# Patient Record
Sex: Female | Born: 1980 | Hispanic: Yes | Marital: Single | Smoking: Never smoker
Health system: Southern US, Community
[De-identification: ages and names within clinical notes are randomized; demographics above are authoritative.]

## PROBLEM LIST (undated history)

## (undated) ENCOUNTER — Inpatient Hospital Stay (HOSPITAL_COMMUNITY): Payer: Self-pay

## (undated) DIAGNOSIS — K8020 Calculus of gallbladder without cholecystitis without obstruction: Secondary | ICD-10-CM

## (undated) DIAGNOSIS — R7611 Nonspecific reaction to tuberculin skin test without active tuberculosis: Secondary | ICD-10-CM

## (undated) DIAGNOSIS — D649 Anemia, unspecified: Secondary | ICD-10-CM

## (undated) DIAGNOSIS — K429 Umbilical hernia without obstruction or gangrene: Secondary | ICD-10-CM

## (undated) DIAGNOSIS — Z789 Other specified health status: Secondary | ICD-10-CM

## (undated) HISTORY — DX: Umbilical hernia without obstruction or gangrene: K42.9

## (undated) HISTORY — DX: Anemia, unspecified: D64.9

## (undated) HISTORY — DX: Nonspecific reaction to tuberculin skin test without active tuberculosis: R76.11

## (undated) HISTORY — DX: Calculus of gallbladder without cholecystitis without obstruction: K80.20

## (undated) HISTORY — PX: NO PAST SURGERIES: SHX2092

---

## 2012-03-04 ENCOUNTER — Other Ambulatory Visit (HOSPITAL_COMMUNITY): Payer: Self-pay | Admitting: Physician Assistant

## 2012-03-04 DIAGNOSIS — Z3689 Encounter for other specified antenatal screening: Secondary | ICD-10-CM

## 2012-03-04 LAB — OB RESULTS CONSOLE GC/CHLAMYDIA
Chlamydia: NEGATIVE
Gonorrhea: NEGATIVE

## 2012-03-04 LAB — OB RESULTS CONSOLE HEPATITIS B SURFACE ANTIGEN: Hepatitis B Surface Ag: NEGATIVE

## 2012-03-04 LAB — OB RESULTS CONSOLE ABO/RH: RH Type: POSITIVE

## 2012-03-04 LAB — OB RESULTS CONSOLE RUBELLA ANTIBODY, IGM: Rubella: IMMUNE

## 2012-03-04 LAB — OB RESULTS CONSOLE HIV ANTIBODY (ROUTINE TESTING): HIV: NONREACTIVE

## 2012-03-04 LAB — OB RESULTS CONSOLE ANTIBODY SCREEN: Antibody Screen: NEGATIVE

## 2012-03-04 LAB — OB RESULTS CONSOLE RPR: RPR: NONREACTIVE

## 2012-03-09 ENCOUNTER — Ambulatory Visit (HOSPITAL_COMMUNITY)
Admission: RE | Admit: 2012-03-09 | Discharge: 2012-03-09 | Disposition: A | Discharge status: Home / Self Care | Payer: Medicaid Other | Source: Ambulatory Visit | Attending: Physician Assistant | Admitting: Physician Assistant

## 2012-03-09 DIAGNOSIS — Z3689 Encounter for other specified antenatal screening: Secondary | ICD-10-CM

## 2012-03-09 DIAGNOSIS — O358XX0 Maternal care for other (suspected) fetal abnormality and damage, not applicable or unspecified: Secondary | ICD-10-CM | POA: Insufficient documentation

## 2012-03-09 DIAGNOSIS — Z363 Encounter for antenatal screening for malformations: Secondary | ICD-10-CM | POA: Insufficient documentation

## 2012-03-09 DIAGNOSIS — Z1389 Encounter for screening for other disorder: Secondary | ICD-10-CM | POA: Insufficient documentation

## 2012-03-16 ENCOUNTER — Inpatient Hospital Stay (HOSPITAL_COMMUNITY)
Admission: AD | Admit: 2012-03-16 | Discharge: 2012-03-16 | Disposition: A | Discharge status: Home / Self Care | Payer: Medicaid Other | Source: Ambulatory Visit | Attending: Obstetrics & Gynecology | Admitting: Obstetrics & Gynecology

## 2012-03-16 ENCOUNTER — Encounter (HOSPITAL_COMMUNITY): Payer: Self-pay

## 2012-03-16 DIAGNOSIS — N949 Unspecified condition associated with female genital organs and menstrual cycle: Secondary | ICD-10-CM

## 2012-03-16 DIAGNOSIS — O99891 Other specified diseases and conditions complicating pregnancy: Secondary | ICD-10-CM | POA: Insufficient documentation

## 2012-03-16 DIAGNOSIS — R109 Unspecified abdominal pain: Secondary | ICD-10-CM | POA: Insufficient documentation

## 2012-03-16 HISTORY — DX: Other specified health status: Z78.9

## 2012-03-16 LAB — URINALYSIS, ROUTINE W REFLEX MICROSCOPIC
Bilirubin Urine: NEGATIVE
Glucose, UA: NEGATIVE mg/dL
Hgb urine dipstick: NEGATIVE
Ketones, ur: NEGATIVE mg/dL
Leukocytes, UA: NEGATIVE
Nitrite: NEGATIVE
Protein, ur: NEGATIVE mg/dL
Specific Gravity, Urine: 1.02 (ref 1.005–1.030)
Urobilinogen, UA: 0.2 mg/dL (ref 0.0–1.0)
pH: 7 (ref 5.0–8.0)

## 2012-03-16 NOTE — MAU Provider Note (Signed)
  History     CSN: [id number]  Arrival date and time: 03/16/12 1118   First Provider Initiated Contact with Patient 03/16/12 1352      Chief Complaint  Patient presents with  . Abdominal Pain   HPI Brenda Richards is 31 y.o. G2P1001 [redacted]w[redacted]d weeks presenting with left lower abdominal pain the entire pregnancy.  [facility]  Had ultrasound here on last week.  Denies vaginal bleeding or discharge.  Pain is intermittent daily. Occurs with standing or moving quickly. It is a pulling type of pain, not sharp per her report.  Hasn't used tylenol for discomfort.  Patient is concerned because she understood the ultrasound report to be abnormal.     Past Medical History  Diagnosis Date  . No pertinent past medical history     Past Surgical History  Procedure Date  . No past surgeries     Family History  Problem Relation Age of Onset  . Anesthesia problems Neg Hx     History  Substance Use Topics  . Smoking status: Never Smoker   . Smokeless tobacco: Not on file  . Alcohol Use: No    Allergies: No Known Allergies  Prescriptions prior to admission  Medication Sig Dispense Refill  . Prenatal Vit-Fe Fumarate-FA (PRENATAL MULTIVITAMIN) TABS Take 1 tablet by mouth every morning.        Review of Systems  Gastrointestinal: Positive for abdominal pain. Vomiting: lower left sided pulling pain.  Genitourinary:       Negative for vaginal bleeding or discharge   Physical Exam   Blood pressure 123/58, pulse 87, temperature 98.1 F (36.7 C), temperature source Oral, resp. rate 16, height 5\' 3"  (1.6 m), weight 73.936 kg (163 lb), SpO2 100.00%.  Physical Exam  Constitutional: She is oriented to person, place, and time. She appears well-developed and well-nourished. No distress.  HENT:  Head: Normocephalic.  Neck: Normal range of motion.  Cardiovascular: Normal rate.   Respiratory: Effort normal.  GI: Soft.  Musculoskeletal: Tenderness:  points to left lateral wall where  discomfort is when it occurs.  Neurological: She is alert and oriented to person, place, and time.  Skin: Skin is warm and dry.  Psychiatric: She has a normal mood and affect. Her behavior is normal.    MAU Course  Procedures  MDM  I reviewed the4/22/13  ultrasound report with the patient.     Assessment and Plan  A:  Round ligament pain at [redacted]w[redacted]d gestation  P:  Instructed to move less quickly and to take tylenol as needed for discomfort.     Keep scheduled appointment made for prenatal care.   Brenda Richards,EVE M 03/16/2012, 2:00 PM

## 2012-03-16 NOTE — Discharge Instructions (Signed)
Dolor abdominal en las mujeres (Abdominal Pain, Women) El dolor abdominal (en el estmago, la pelvis o el vientre) puede tener muchas causas. Es importante que le informe a su mdico:  La ubicacin del Engineer, mining.   Viene y se va, o persiste todo el tiempo?   Hay situaciones que Location manager (comer ciertos alimentos, la actividad fsica)?   Tiene otros sntomas asociados al dolor (fiebre, nuseas, vmitos, diarrea)?  Todo es de gran ayuda cuando se trata de hallar la causa del dolor. CAUSAS  Estmago: Infecciones por virus o bacterias, o lcera.   Intestino: Apendicitis (apndice inflamado), ileitis regional (enfermedad de Crohn), colitis ulcerosa (colon inflamado), sndrome del colon irritable, diverticulitis (inflamacin de los divertculos del colon) o cncer de estmago oo intestino.   Enfermedades de la vescula biliar o clculos.   Enfermedades renales, clculos o infecciones en el rin.   Infeccin o cncer del pncreas.   Fibromialgia (trastorno doloroso)   Enfermedades de los rganos femeninos:   Uterus: tero: fibroma (tumor no canceroso) o infeccin   Trompas de Falopio: infeccin o embarazo ectpico   En los ovarios, quistes o tumores.   Adherencias plvicas (tejido cicatrizal).   Endometriosis (el tejido que cubre el tero se desarrolla en la pelvis y los rganos plvicos).   Sndrome de Agricultural engineer (los rganos femeninos se llenan de sangre antes del periodo menstrual(   Dolor durante el periodo menstrual.   Dolor durante la ovulacin (al producir vulos).   Dolor al usar el DIU (dispositivo intrauterino para el control de la natalidad)   Psychologist, clinical los rganos femeninos.   Dolor funcional (no est originado en una enfermedad, puede mejorar sin tratamiento).   Dolor de origen psicolgico   Depresin.  DIAGNSTICO Su mdico decidir la gravedad del dolor a travs del examen fsico  Anlisis de sangre   Radiografas   Ecografas   TC  (tomografa computada, tipo especial de radiografas).   IMR (resonancia magntica)   Cultivos, en el caso una infeccin   Colon por enema de bario (se inserta una sustancia de contraste en el intestino grueso para mejorar la observacin con rayos X.)   Colonoscopa (observacin del intestino con un tubo luminoso).   Laparoscopa (examen del interior del abdomen con un tubo que tiene [employer]).   Ciruga exploratoria abdominal mayor (se observa el abdomen realizando una gran incisin).  TRATAMIENTO El tratamiento depender de la causa del problema.   Muchos de estos casos pueden controlarse y tratarse en casa.   Medicamentos de venta libre indicados por el mdico.   Medicamentos con receta.   Antibiticos, en caso de infeccin   Pldoras anticonceptivas, en el caso de perodos dolorosos o dolor al ovular.   Tratamiento hormonal, para la endometriosis   Inyecciones para bloqueo nervioso selectivo.   Fisioterapia.   Antidepresivos.   Consejos por parte de un psclogo o psiquiatra.   Ciruga mayor o menor.  INSTRUCCIONES PARA EL CUIDADO DOMICILIARIO  No tome ni administre laxantes a menos que se lo haya indicado su mdico.   Tome analgsicos de venta libre slo si se lo ha indicado el profesional que lo asiste. No tome aspirina, ya que puede causar [employer] o hemorragias.   Consuma una dieta lquida (caldo o agua) segn lo indicado por el mdico. Progrese lentamente a una dieta blanda, segn la tolerancia, si el dolor se relaciona con el estmago o el intestino.   Tenga un termmetro y [employer] temperatura varias veces al  da.   Maricela Curet reposo en la cama y [place], si esto Research scientist (life sciences).   Evite las relaciones sexuales, Counsellor.   Evite las situaciones estresantes.   Cumpla con las visitas y los anlisis de control, segn las indicaciones de su mdico.   Si el dolor no se [place] con los medicamentos o la [place], [place] tratar con:    Acupuntura.   Ejercicios de relajacin (yoga, meditacin).   Terapia grupal.   Psicoterapia.  SOLICITE ATENCIN MDICA SI:  Nota que ciertos Pharmacist, community de [place].   El tratamiento indicado para Arboriculturist no Marketing executive.   Necesita analgsicos ms fuertes.   Quiere que le retiren el DIU.   Si se siente confundido o desfalleciente.   Presenta nuseas o vmitos.   Aparece una erupcin cutnea.   Sufre efectos adversos o una reaccin alrgica debido a los medicamentos que toma.  SOLICITE ATENCIN MDICA DE INMEDIATO SI:  El dolor persiste o se agrava.   Tiene fiebre.   Siente el dolor slo en algunos sectores del abdomen. Si se localiza en la zona derecha, posiblemente podra tratarse de apendicitis. En un adulto, si se localiza en la regin inferior izquierda del abdomen, podra tratarse de colitis o diverticulitis.   Hay sangre en las heces (deposiciones de color rojo brillante o negro alquitranado), con o sin vmitos.   Usted presenta sangre en la orina.   Siente escalofros con o sin fiebre.   Se desmaya.  ASEGRESE QUE:   Comprende estas instrucciones.   Controlar su enfermedad.   Solicitar ayuda de inmediato si no mejora o si empeora.  Document Released: 02/20/2009 Document Revised: 10/24/2011 [facility] Patient Information 2012 [place], [place].Round Ligament Pain The round ligament is made up of muscle and fibrous tissue. It is attached to the uterus near the fallopian tube. The round ligament is located on both sides of the uterus and helps support the position of the uterus. It usually begins in the second trimester of pregnancy when the uterus comes out of the pelvis. The pain can come and go until the baby is delivered. Round ligament pain is not a serious problem and does not cause harm to the baby. CAUSE During pregnancy the uterus grows the most from the second trimester to delivery. As it grows, it stretches and  slightly twists the round ligaments. When the uterus leans from one side to the other, the round ligament on the opposite side pulls and stretches. This can cause pain. SYMPTOMS  Pain can occur on one side or both sides. The pain is usually a short, sharp, and pinching-like. Sometimes it can be a dull, lingering and aching pain. The pain is located in the lower side of the abdomen or in the groin. The pain is internal and usually starts deep in the groin and moves up to the outside of the hip area. Pain can occur with:  Sudden change in position like getting out of bed or a chair.   Rolling over in bed.   Coughing or sneezing.   Walking too much.   Any type of physical activity.  DIAGNOSIS  Your caregiver will make sure there are no serious problems causing the pain. When nothing serious is found, the symptoms usually indicate that the pain is from the round ligament. TREATMENT   Sit down and relax when the pain starts.   Flex your knees up to your belly.   Lay on your side  with a pillow under your belly (abdomen) and another one between your legs.   Sit in a hot bath for 15 to 20 minutes or until the pain goes away.  HOME CARE INSTRUCTIONS   Only take over-the-counter or prescriptions medicines for pain, discomfort or fever as directed by your caregiver.   Sit and stand slowly.   Avoid long walks if it causes pain.   Stop or lessen your physical activities if it causes pain.  SEEK MEDICAL CARE IF:   The pain does not go away with any of your treatment.   You need stronger medication for the pain.   You develop back pain that you did not have before with the side pain.  SEEK IMMEDIATE MEDICAL CARE IF:   You develop a temperature of 102 F (38.9 C) or higher.   You develop uterine contractions.   You develop vaginal bleeding.   You develop nausea, vomiting or diarrhea.   You develop chills.   You have pain when you urinate.  Document Released: 08/13/2008 Document  Revised: 10/24/2011 Document Reviewed: 08/13/2008 [facility] Patient Information 2012 [place], [place].

## 2012-03-16 NOTE — MAU Note (Signed)
Pt in c/o sharp left lower abdominal pain off and on throughout pregnancy.  States pain is relieved when laying on stomach, but unable to now.  + FM.  Denies any bleeding or lof.

## 2012-03-16 NOTE — MAU Note (Signed)
Patient states she has been having lower left abdominal pain on and off since early in the pregnancy. Comes and goes and is worse with movement. Denies any bleeding or leaking and reports feeling fetal movement.

## 2012-03-17 NOTE — MAU Provider Note (Signed)
Medical Screening exam and patient care preformed by advanced practice provider.  Agree with the above management.  

## 2012-06-23 LAB — OB RESULTS CONSOLE GBS: GBS: NEGATIVE

## 2012-07-13 ENCOUNTER — Encounter (HOSPITAL_COMMUNITY): Payer: Self-pay | Admitting: *Deleted

## 2012-07-13 ENCOUNTER — Telehealth (HOSPITAL_COMMUNITY): Payer: Self-pay | Admitting: *Deleted

## 2012-07-13 NOTE — Telephone Encounter (Signed)
Preadmission screen  

## 2012-07-14 ENCOUNTER — Other Ambulatory Visit (HOSPITAL_COMMUNITY): Payer: Self-pay | Admitting: Physician Assistant

## 2012-07-14 DIAGNOSIS — O480 Post-term pregnancy: Secondary | ICD-10-CM

## 2012-07-15 ENCOUNTER — Telehealth (HOSPITAL_COMMUNITY): Payer: Self-pay | Admitting: *Deleted

## 2012-07-15 NOTE — Telephone Encounter (Signed)
Preadmission screen  

## 2012-07-16 ENCOUNTER — Encounter (HOSPITAL_COMMUNITY): Payer: Self-pay | Admitting: *Deleted

## 2012-07-16 ENCOUNTER — Inpatient Hospital Stay (HOSPITAL_COMMUNITY)
Admission: AD | Admit: 2012-07-16 | Discharge: 2012-07-19 | DRG: 765 | Disposition: A | Discharge status: Home / Self Care | Payer: Medicaid Other | Source: Ambulatory Visit | Attending: Obstetrics and Gynecology | Admitting: Obstetrics and Gynecology

## 2012-07-16 DIAGNOSIS — O411090 Infection of amniotic sac and membranes, unspecified, unspecified trimester, not applicable or unspecified: Secondary | ICD-10-CM | POA: Diagnosis present

## 2012-07-16 DIAGNOSIS — D649 Anemia, unspecified: Secondary | ICD-10-CM | POA: Diagnosis not present

## 2012-07-16 DIAGNOSIS — O9903 Anemia complicating the puerperium: Secondary | ICD-10-CM | POA: Diagnosis not present

## 2012-07-16 LAB — URINALYSIS, ROUTINE W REFLEX MICROSCOPIC
Bilirubin Urine: NEGATIVE
Glucose, UA: NEGATIVE mg/dL
Ketones, ur: NEGATIVE mg/dL
Nitrite: NEGATIVE
Protein, ur: NEGATIVE mg/dL
Specific Gravity, Urine: 1.01 (ref 1.005–1.030)
Urobilinogen, UA: 0.2 mg/dL (ref 0.0–1.0)
pH: 6.5 (ref 5.0–8.0)

## 2012-07-16 LAB — CBC
HCT: 36.8 % (ref 36.0–46.0)
Hemoglobin: 12 g/dL (ref 12.0–15.0)
MCH: 26 pg (ref 26.0–34.0)
MCHC: 32.6 g/dL (ref 30.0–36.0)
MCV: 79.8 fL (ref 78.0–100.0)
Platelets: 270 10*3/uL (ref 150–400)
RBC: 4.61 MIL/uL (ref 3.87–5.11)
RDW: 14.5 % (ref 11.5–15.5)
WBC: 10.9 10*3/uL — ABNORMAL HIGH (ref 4.0–10.5)

## 2012-07-16 LAB — RPR: RPR Ser Ql: NONREACTIVE

## 2012-07-16 LAB — URINE MICROSCOPIC-ADD ON

## 2012-07-16 MED ORDER — OXYTOCIN BOLUS FROM INFUSION
250.0000 mL | Freq: Once | INTRAVENOUS | Status: DC
  Filled 2012-07-16: qty 500

## 2012-07-16 MED ORDER — GENTAMICIN SULFATE 40 MG/ML IJ SOLN
160.0000 mg | Freq: Three times a day (TID) | INTRAVENOUS | Status: DC
  Administered 2012-07-16: 160 mg via INTRAVENOUS
  Filled 2012-07-16 (×3): qty 4

## 2012-07-16 MED ORDER — PHENYLEPHRINE 40 MCG/ML (10ML) SYRINGE FOR IV PUSH (FOR BLOOD PRESSURE SUPPORT)
80.0000 ug | PREFILLED_SYRINGE | INTRAVENOUS | Status: DC | PRN
  Filled 2012-07-16: qty 5

## 2012-07-16 MED ORDER — OXYTOCIN 40 UNITS IN LACTATED RINGERS INFUSION - SIMPLE MED
1.0000 m[IU]/min | INTRAVENOUS | Status: DC
  Administered 2012-07-16: 2 m[IU]/min via INTRAVENOUS

## 2012-07-16 MED ORDER — TERBUTALINE SULFATE 1 MG/ML IJ SOLN: 0.2500 mg | Freq: Once | INTRAMUSCULAR | Status: AC | PRN

## 2012-07-16 MED ORDER — IBUPROFEN 600 MG PO TABS: 600.0000 mg | ORAL_TABLET | Freq: Four times a day (QID) | ORAL | Status: DC | PRN

## 2012-07-16 MED ORDER — LIDOCAINE HCL (PF) 1 % IJ SOLN: 30.0000 mL | INTRAMUSCULAR | Status: DC | PRN

## 2012-07-16 MED ORDER — FLEET ENEMA 7-19 GM/118ML RE ENEM: 1.0000 | ENEMA | RECTAL | Status: DC | PRN

## 2012-07-16 MED ORDER — EPHEDRINE 5 MG/ML INJ
10.0000 mg | INTRAVENOUS | Status: DC | PRN
  Filled 2012-07-16: qty 4

## 2012-07-16 MED ORDER — ONDANSETRON HCL 4 MG/2ML IJ SOLN: 4.0000 mg | Freq: Four times a day (QID) | INTRAMUSCULAR | Status: DC | PRN

## 2012-07-16 MED ORDER — LIDOCAINE HCL (PF) 1 % IJ SOLN
INTRAMUSCULAR | Status: DC | PRN
  Administered 2012-07-16 (×3): 4 mL

## 2012-07-16 MED ORDER — AMPICILLIN SODIUM 2 G IJ SOLR
2.0000 g | Freq: Four times a day (QID) | INTRAMUSCULAR | Status: DC
  Administered 2012-07-16 – 2012-07-17 (×2): 2 g via INTRAVENOUS
  Filled 2012-07-16 (×4): qty 2000

## 2012-07-16 MED ORDER — LACTATED RINGERS IV SOLN
INTRAVENOUS | Status: DC
  Administered 2012-07-16 (×3): via INTRAVENOUS

## 2012-07-16 MED ORDER — LACTATED RINGERS IV SOLN
500.0000 mL | Freq: Once | INTRAVENOUS | Status: AC
  Administered 2012-07-16: 500 mL via INTRAVENOUS

## 2012-07-16 MED ORDER — NALBUPHINE SYRINGE 5 MG/0.5 ML: 10.0000 mg | INJECTION | INTRAMUSCULAR | Status: DC | PRN

## 2012-07-16 MED ORDER — LACTATED RINGERS IV SOLN: 500.0000 mL | INTRAVENOUS | Status: DC | PRN

## 2012-07-16 MED ORDER — PHENYLEPHRINE 40 MCG/ML (10ML) SYRINGE FOR IV PUSH (FOR BLOOD PRESSURE SUPPORT): 80.0000 ug | PREFILLED_SYRINGE | INTRAVENOUS | Status: DC | PRN

## 2012-07-16 MED ORDER — ACETAMINOPHEN 325 MG PO TABS
650.0000 mg | ORAL_TABLET | ORAL | Status: DC | PRN
  Administered 2012-07-16 (×2): 650 mg via ORAL
  Filled 2012-07-16 (×2): qty 2

## 2012-07-16 MED ORDER — DIPHENHYDRAMINE HCL 50 MG/ML IJ SOLN: 12.5000 mg | INTRAMUSCULAR | Status: DC | PRN

## 2012-07-16 MED ORDER — FENTANYL 2.5 MCG/ML BUPIVACAINE 1/10 % EPIDURAL INFUSION (WH - ANES)
14.0000 mL/h | INTRAMUSCULAR | Status: DC
  Administered 2012-07-16 (×5): 14 mL/h via EPIDURAL
  Filled 2012-07-16 (×5): qty 60

## 2012-07-16 MED ORDER — OXYCODONE-ACETAMINOPHEN 5-325 MG PO TABS: 1.0000 | ORAL_TABLET | ORAL | Status: DC | PRN

## 2012-07-16 MED ORDER — EPHEDRINE 5 MG/ML INJ: 10.0000 mg | INTRAVENOUS | Status: DC | PRN

## 2012-07-16 MED ORDER — OXYTOCIN 40 UNITS IN LACTATED RINGERS INFUSION - SIMPLE MED
62.5000 mL/h | Freq: Once | INTRAVENOUS | Status: DC
  Filled 2012-07-16: qty 1000

## 2012-07-16 MED ORDER — CITRIC ACID-SODIUM CITRATE 334-500 MG/5ML PO SOLN
30.0000 mL | ORAL | Status: DC | PRN
  Administered 2012-07-17: 30 mL via ORAL
  Filled 2012-07-16: qty 15

## 2012-07-16 NOTE — Progress Notes (Signed)
Patient ID: [name], female   DOB: 1981/08/01, 31 y.o.   MRN: [id number]  Doing well, comfortable  FHR reassuring, not reactive by criteria right now No decels  UCs every 2 min  Dilation: 8 Effacement (%): 100 Cervical Position: Middle Station: +1 Presentation: Vertex Exam by:: B.Cagna,Rn  Turned to other side to rotate baby. Side of cervix is swollen per RN.  Will continue to observe

## 2012-07-16 NOTE — Progress Notes (Addendum)
Brenda Richards is a 31 y.o. G2P1001 at [redacted]w[redacted]d admitted for active labor  Subjective: Comfortable with epidural, beginning to feel some pressure  Objective: BP 141/70  Pulse 123  Temp 100.5 F (38.1 C) (Axillary)  Resp 18  Ht 5\' 4"  (1.626 m)  Wt 82.555 kg (182 lb)  BMI 31.24 kg/m2  SpO2 99%      FHT:  160, min-mod variability, no spontaneous accels, mild variables and earlies.  +scalp stim UC:   regular, every 1.5-4 minutes, coupling pattern, MVUs 170-190 SVE:   7/90/+1 with caput, cervix feels like swelling on Rt  Hot to touch during sve  Ruptured x 6.5 hours  Rechecked temp, 98.3 oral, 100.5 axillary  No uterine tenderness or foul odor to fluid  Labs: Lab Results  Component Value Date   WBC 10.9* 07/16/2012   HGB 12.0 07/16/2012   HCT 36.8 07/16/2012   MCV 79.8 07/16/2012   PLT 270 07/16/2012    Assessment / Plan: Spontaneous labor w/ pit & arom augmentation, progressing slowly   Labor: Progressing slowly on pitocin, will begin increasing again  Preeclampsia:  n/a Fetal Wellbeing:  Category II Pain Control:  Epidural I/D:  Suspected chorioamnionitis, will begin amp & gent  Anticipated MOD:  NSVD  Repositioned to Rt exaggerated sims; pt to receive tylenol for fever  Updated Dr. Jolayne Panther on pt status, interventions, and plan of care.  Brenda Richards 07/16/2012, 6:37 PM

## 2012-07-16 NOTE — Anesthesia Procedure Notes (Addendum)
Epidural   Epidural Patient location during procedure: OB  Staffing Performed by: anesthesiologist   Preanesthetic Checklist Completed: patient identified, site marked, surgical consent, pre-op evaluation, timeout performed, IV checked, risks and benefits discussed and monitors and equipment checked  Epidural Patient position: sitting Prep: site prepped and draped and DuraPrep Patient monitoring: continuous pulse ox and blood pressure Approach: midline Injection technique: LOR air  Needle:  Needle type: Tuohy  Needle gauge: 17 G Needle length: 9 cm and 9 Needle insertion depth: 6 and 6 cm Catheter type: closed end flexible Catheter size: 19 Gauge Catheter at skin depth: 11 cm Test dose: negative  Assessment Events: blood not aspirated, injection not painful, no injection resistance, negative IV test and no paresthesia  Additional Notes Discussed risk of headache, infection, bleeding, nerve injury and failed or incomplete block.  Patient voices understanding and wishes to proceed. Reason for block:procedure for pain

## 2012-07-16 NOTE — Progress Notes (Signed)
Brenda Richards is a 31 y.o. G2P1001 at [redacted]w[redacted]d admitted for active labor  Subjective: Mainly comfortable with epidural, feeling a lot of pressure with uc's  Objective: BP 138/89  Pulse 86  Temp 98.4 F (36.9 C) (Oral)  Resp 16  Ht 5\' 4"  (1.626 m)  Wt 82.555 kg (182 lb)  BMI 31.24 kg/m2  SpO2 99%      FHT:  FHR: 140 bpm, variability: moderate,  accelerations:  Present,  decelerations:  Present variables UC:   regular, every 2-4 minutes, not tracing well via toco- toco repositioned by rn SVE:   Dilation: 6 Effacement (%): 100 Station: 0 Exam by:: Rilley Stash, CNM  AROM scant amount clear fluid  Labs: Lab Results  Component Value Date   WBC 10.9* 07/16/2012   HGB 12.0 07/16/2012   HCT 36.8 07/16/2012   MCV 79.8 07/16/2012   PLT 270 07/16/2012    Assessment / Plan: Spontaneous labor, progressing normally  Labor: Progressing normally, now AROM'd Preeclampsia:  n/a Fetal Wellbeing:  Category II Pain Control:  Epidural I/D:  n/a Anticipated MOD:  NSVD  Marge Duncans 07/16/2012, 11:54 AM

## 2012-07-16 NOTE — Progress Notes (Signed)
Brenda Richards is a 31 y.o. G2P1001 at [redacted]w[redacted]d admitted for active labor  Subjective: Comfortable with epidural  Objective: BP 135/71  Pulse 93  Temp 98.6 F (37 C) (Oral)  Resp 18  Ht 5\' 4"  (1.626 m)  Wt 82.555 kg (182 lb)  BMI 31.24 kg/m2  SpO2 99%      FHT:  FHR: 145 bpm, variability: moderate,  accelerations:  Present,  decelerations:  Present variables, ?prolonged- unable to adequately assess decels d/t not tracing uc's w/ toco UC:   regular, every approximately 2-3 minutes, not tracing via toco- iupc placed SVE:   6.5/100/0 to +1  IUPC placed  Labs: Lab Results  Component Value Date   WBC 10.9* 07/16/2012   HGB 12.0 07/16/2012   HCT 36.8 07/16/2012   MCV 79.8 07/16/2012   PLT 270 07/16/2012    Assessment / Plan: spontaneous labor, progressing slowly on pitocin  Labor: progressing slowly Preeclampsia:  n/a Fetal Wellbeing:  Category II Pain Control:  Epidural I/D:  n/a Anticipated MOD:  NSVD  Marge Duncans 07/16/2012, 4:42 PM

## 2012-07-16 NOTE — Anesthesia Preprocedure Evaluation (Addendum)
Anesthesia Evaluation  Patient identified by MRN, date of birth, ID band Patient awake    Reviewed: Allergy & Precautions, H&P , NPO status , Patient's Chart, lab work & pertinent test results, reviewed documented beta blocker date and time   History of Anesthesia Complications Negative for: history of anesthetic complications  Airway Mallampati: II TM Distance: >3 FB Neck ROM: full    Dental  (+) Teeth Intact   Pulmonary neg pulmonary ROS,  breath sounds clear to auscultation        Cardiovascular negative cardio ROS  Rhythm:regular Rate:Normal     Neuro/Psych negative neurological ROS  negative psych ROS   GI/Hepatic negative GI ROS, Neg liver ROS,   Endo/Other  negative endocrine ROS  Renal/GU negative Renal ROS     Musculoskeletal   Abdominal   Peds  Hematology negative hematology ROS (+)   Anesthesia Other Findings   Reproductive/Obstetrics (+) Pregnancy                           Anesthesia Physical Anesthesia Plan  ASA: II and Emergent  Anesthesia Plan: Epidural   Post-op Pain Management:    Induction:   Airway Management Planned:   Additional Equipment:   Intra-op Plan:   Post-operative Plan:   Informed Consent: I have reviewed the patients History and Physical, chart, labs and discussed the procedure including the risks, benefits and alternatives for the proposed anesthesia with the patient or authorized representative who has indicated his/her understanding and acceptance.     Plan Discussed with:   Anesthesia Plan Comments:        Anesthesia Quick Evaluation

## 2012-07-16 NOTE — Progress Notes (Signed)
Brenda Richards is a 31 y.o. G2P1001 at [redacted]w[redacted]d admitted for active labor  Subjective: Comfortable with epidural, feeling some pressure with uc's.    Objective: BP 141/70  Pulse 85  Temp 98.4 F (36.9 C) (Oral)  Resp 18  Ht 5\' 4"  (1.626 m)  Wt 82.555 kg (182 lb)  BMI 31.24 kg/m2  SpO2 99%      FHT:  FHR: 140 bpm, variability: moderate,  accelerations:  Present,  decelerations:  Absent UC:   regular, every 2-4 minutes SVE:   Dilation: 5 Effacement (%): 100 Station: 0 Exam by:: lee  Labs: Lab Results  Component Value Date   WBC 10.9* 07/16/2012   HGB 12.0 07/16/2012   HCT 36.8 07/16/2012   MCV 79.8 07/16/2012   PLT 270 07/16/2012    Assessment / Plan: Spontaneous labor, progressing normally  Labor: Progressing normally Preeclampsia:  n/a Fetal Wellbeing:  Category I Pain Control:  Epidural I/D:  n/a Anticipated MOD:  NSVD  Cheral Marker, CNM, WHNP-BC 07/16/2012, 10:08 AM

## 2012-07-16 NOTE — Progress Notes (Signed)
ANTIBIOTIC CONSULT NOTE - INITIAL  Pharmacy Consult for Gentamicin Indication: Suspected chorioamnionitis  No Known Allergies  Patient Measurements: Height: 5\' 4"  (162.6 cm) Weight: 182 lb (82.555 kg) IBW/kg (Calculated) : 54.7  Adjusted Body Weight: 63.1kg  Vital Signs: Temp: 99.5 F (37.5 C) (08/29 1907) Temp src: Axillary (08/29 1907) BP: 138/75 mmHg (08/29 1902) Pulse Rate: 118  (08/29 1902)   Labs:  Basename 07/16/12 0700  WBC 10.9*  HGB 12.0  PLT 270  LABCREA --  CREATININE --   Estimated SCr= 0.7 with estimated CrCl > 181ml/min  Medical History: Past Medical History  Diagnosis Date  . No pertinent past medical history   . Positive PPD, treated     Medications:  Ampicillin 2 gram IV q6h Assessment: 30yo 40+ weeks admitted in active labor. Now, pt with maternal temp > 100 and antibiotics initiated.  Goal of Therapy:  Gentamicin peaks 6-5mcg/ml and trough < 2mcg/ml.  Plan:  1. Gentamicin 160mg  IV q8h. 2. Draw SCr in am if meds continued postpartum. 3. Will continue to follow and assess need for Gentamicin levels. Thanks!  Claybon Jabs 07/16/2012,7:10 PM

## 2012-07-16 NOTE — Progress Notes (Signed)
Brenda Richards is a 31 y.o. G2P1001 at [redacted]w[redacted]d admitted for active labor  Subjective: Comfortable with epidural. Family supportive at bedside.  Objective: BP 138/98  Pulse 101  Temp 98.5 F (36.9 C) (Oral)  Resp 16  Ht 5\' 4"  (1.626 m)  Wt 82.555 kg (182 lb)  BMI 31.24 kg/m2  SpO2 99%      FHT:  FHR: 145 bpm, variability: moderate,  accelerations:  Present,  decelerations:  Present earlies and variables UC:   irregular, every 2-5 minutes SVE:   Dilation: 6 Effacement (%): 100 Station: +1 Exam by:: lee  Labs: Lab Results  Component Value Date   WBC 10.9* 07/16/2012   HGB 12.0 07/16/2012   HCT 36.8 07/16/2012   MCV 79.8 07/16/2012   PLT 270 07/16/2012    Assessment / Plan: Spontaneous labor, arrest of dilation  Labor: arrest of dilation, will begin augmentation with low-dose pitocin Preeclampsia:  n/a Fetal Wellbeing:  Category II Pain Control:  Epidural I/D:  n/a Anticipated MOD:  NSVD  Marge Duncans 07/16/2012, 3:31 PM

## 2012-07-16 NOTE — MAU Provider Note (Signed)
History     CSN: [id number]  Arrival date and time: 07/16/12 0348   None     Chief Complaint  Patient presents with  . Abdominal Pain   HPI Brenda Richards 31 y.o. female  G2P1001 at [redacted]w[redacted]d presenting with contractions (health department patient).  Contractions for 2-3 hours approximately 5 minutes apart. Patient can somewhat talk through them.   Patient denies decreased fetal movement/abnormal discharge/blood from vagina/rush of fluid. Patient does note that she passed her blood tinged "mucus plug" earlier today.     OB History    Grav Para Term Preterm Abortions TAB SAB Ect Mult Living   2 1 1  0  0 0 0 0 1    1st child 7 lbs  Past Medical History  Diagnosis Date  . No pertinent past medical history   . Positive PPD, treated     Past Surgical History  Procedure Date  . No past surgeries     Family History  Problem Relation Age of Onset  . Anesthesia problems Neg Hx     History  Substance Use Topics  . Smoking status: Never Smoker   . Smokeless tobacco: Never Used  . Alcohol Use: Yes     in early pregnancy    Allergies: No Known Allergies  Prescriptions prior to admission  Medication Sig Dispense Refill  . Prenatal Vit-Fe Fumarate-FA (PRENATAL MULTIVITAMIN) TABS Take 1 tablet by mouth every morning.        Prenatal Transfer Tool  Maternal Diabetes: No Genetic Screening: Declined Maternal Ultrasounds/Referrals: Normal Fetal Ultrasounds or other Referrals:  None Maternal Substance Abuse:  No Significant Maternal Medications:  None Significant Maternal Lab Results:  None Other Comments:  Late PNC at HD at 21 weeks.   Prenatal labs: ABO, Rh: O/Positive/-- (04/17 0000) Antibody: Negative (04/17 0000) Rubella: Immune (04/17 0000) RPR: Nonreactive (04/17 0000)  HBsAg: Negative (04/17 0000)  HIV: Non-reactive (04/17 0000)  GBS: Negative (08/06 0000)  1 hour GTT 113   ROS see HPI Physical Exam   Blood pressure 139/88, pulse 94,  temperature 97.6 F (36.4 C), temperature source Oral, resp. rate 18, height 5\' 4"  (1.626 m), weight 82.555 kg (182 lb).  Physical Exam  Constitutional: She is oriented to person, place, and time. She appears well-developed and well-nourished.  HENT:  Head: Normocephalic and atraumatic.  Mouth/Throat: No oropharyngeal exudate.  Eyes: Conjunctivae and EOM are normal. Pupils are equal, round, and reactive to light.  Neck: Normal range of motion. Neck supple.  Cardiovascular: Normal rate and regular rhythm.  Exam reveals no gallop and no friction rub.   No murmur heard. Respiratory: Effort normal and breath sounds normal. She has no wheezes. She has no rales.  GI: Soft. Bowel sounds are normal.       Gravid. Fundal height consistent with full term.    Musculoskeletal: Normal range of motion. She exhibits no edema.  Neurological: She is alert and oriented to person, place, and time.  Skin: Skin is warm and dry.   Dilation: 3 Effacement (%): 80 Cervical Position: Posterior Station: -2 Presentation: Vertex Exam by:: Brenda Higinbotham,MD  FHT: 145 baseline, accels present, no decels, moderate variability. Contractions every 3-5 minutes.   MAU Course  Procedures  MDM Multiparous with minimal cervical change over 1 hour but consistent contractions, will monitor for 1 more hour before making decision to discharge/admit  ON repeat exam, patient now 4cm, will admit  Assessment and Plan  Brenda Richards 31 y.o. female  G2P1001  at [redacted]w[redacted]d presenting in active labor (health department patient).  Admit to L+D Expect SVD May have epidural upon request GBS negative    Brenda Richards 07/16/2012, 5:33 AM

## 2012-07-16 NOTE — MAU Note (Signed)
Pt reports pressure in lower abd and pain with urination today. Some blood on tissue when she wipes.

## 2012-07-16 NOTE — Progress Notes (Signed)
Brenda Richards is a 31 y.o. G2P1001 at [redacted]w[redacted]d  admitted for active labor  Subjective: Comfortable with epidural, family supportive at bedside  Objective: BP 119/61  Pulse 101  Temp 98.6 F (37 C) (Oral)  Resp 18  Ht 5\' 4"  (1.626 m)  Wt 82.555 kg (182 lb)  BMI 31.24 kg/m2  SpO2 99%      FHT:  FHR: 165 bpm, variability: minimal ,  accelerations:  Abscent,  decelerations:  Present earlies UC:   regular, every 1.5-3 minutes, MVUs 250 SVE:   Dilation: 6.5 Effacement (%): 100 Station: +1 Exam by:: Brenda Richards. cnm  Labs: Lab Results  Component Value Date   WBC 10.9* 07/16/2012   HGB 12.0 07/16/2012   HCT 36.8 07/16/2012   MCV 79.8 07/16/2012   PLT 270 07/16/2012    Assessment / Plan: Spontaneous labor, augmented w/ pit and arom  Labor: sve deferred at this time Preeclampsia:  n/a Fetal Wellbeing:  Category II Pain Control:  Epidural I/D:  n/a Anticipated MOD:  NSVD  Afebrile, on 40mu/min pitocin, will decrease to 96mu/min to see if fhr returns to normal baseline and variability  Brenda Richards 07/16/2012, 5:36 PM

## 2012-07-17 ENCOUNTER — Ambulatory Visit (HOSPITAL_COMMUNITY): Admission: RE | Admit: 2012-07-17 | Payer: Medicaid Other | Source: Ambulatory Visit

## 2012-07-17 ENCOUNTER — Encounter (HOSPITAL_COMMUNITY): Payer: Self-pay | Admitting: Anesthesiology

## 2012-07-17 ENCOUNTER — Encounter (HOSPITAL_COMMUNITY)
Admission: AD | Disposition: A | Discharge status: Home / Self Care | Payer: Self-pay | Source: Ambulatory Visit | Attending: Obstetrics and Gynecology

## 2012-07-17 ENCOUNTER — Encounter (HOSPITAL_COMMUNITY): Payer: Self-pay | Admitting: *Deleted

## 2012-07-17 ENCOUNTER — Inpatient Hospital Stay (HOSPITAL_COMMUNITY): Payer: Medicaid Other | Admitting: Anesthesiology

## 2012-07-17 DIAGNOSIS — O411090 Infection of amniotic sac and membranes, unspecified, unspecified trimester, not applicable or unspecified: Secondary | ICD-10-CM

## 2012-07-17 DIAGNOSIS — O9903 Anemia complicating the puerperium: Secondary | ICD-10-CM

## 2012-07-17 LAB — CREATININE, SERUM
Creatinine, Ser: 0.59 mg/dL (ref 0.50–1.10)
GFR calc Af Amer: >90 mL/min (ref >90)
GFR calc non Af Amer: >90 mL/min (ref >90)

## 2012-07-17 LAB — ABO/RH: ABO/RH(D): O POS

## 2012-07-17 SURGERY — Surgical Case
Anesthesia: Epidural | Site: Abdomen | Wound class: Clean Contaminated

## 2012-07-17 MED ORDER — DIPHENHYDRAMINE HCL 25 MG PO CAPS: 25.0000 mg | ORAL_CAPSULE | ORAL | Status: DC | PRN

## 2012-07-17 MED ORDER — DIPHENHYDRAMINE HCL 50 MG/ML IJ SOLN: 25.0000 mg | INTRAMUSCULAR | Status: DC | PRN

## 2012-07-17 MED ORDER — OXYCODONE-ACETAMINOPHEN 5-325 MG PO TABS
1.0000 | ORAL_TABLET | ORAL | Status: DC | PRN
  Administered 2012-07-18 – 2012-07-19 (×4): 1 via ORAL
  Filled 2012-07-17 (×4): qty 1

## 2012-07-17 MED ORDER — GENTAMICIN SULFATE 40 MG/ML IJ SOLN: 160.0000 mg | Freq: Three times a day (TID) | INTRAVENOUS | Status: DC

## 2012-07-17 MED ORDER — IBUPROFEN 600 MG PO TABS
600.0000 mg | ORAL_TABLET | Freq: Four times a day (QID) | ORAL | Status: DC
  Administered 2012-07-17 – 2012-07-19 (×7): 600 mg via ORAL

## 2012-07-17 MED ORDER — MEPERIDINE HCL 25 MG/ML IJ SOLN
6.2500 mg | INTRAMUSCULAR | Status: DC | PRN
  Administered 2012-07-17: 6.25 mg via INTRAVENOUS

## 2012-07-17 MED ORDER — DIPHENHYDRAMINE HCL 25 MG PO CAPS: 25.0000 mg | ORAL_CAPSULE | Freq: Four times a day (QID) | ORAL | Status: DC | PRN

## 2012-07-17 MED ORDER — SCOPOLAMINE 1 MG/3DAYS TD PT72
1.0000 | MEDICATED_PATCH | Freq: Once | TRANSDERMAL | Status: DC
  Administered 2012-07-17: 1.5 mg via TRANSDERMAL

## 2012-07-17 MED ORDER — SODIUM CHLORIDE 0.9 % IJ SOLN: 3.0000 mL | INTRAMUSCULAR | Status: DC | PRN

## 2012-07-17 MED ORDER — SODIUM CHLORIDE 0.9 % IV SOLN
2.0000 g | Freq: Four times a day (QID) | INTRAVENOUS | Status: AC
  Administered 2012-07-17 – 2012-07-18 (×4): 2 g via INTRAVENOUS
  Filled 2012-07-17 (×4): qty 2000

## 2012-07-17 MED ORDER — MENTHOL 3 MG MT LOZG: 1.0000 | LOZENGE | OROMUCOSAL | Status: DC | PRN

## 2012-07-17 MED ORDER — DIBUCAINE 1 % RE OINT: 1.0000 "application " | TOPICAL_OINTMENT | RECTAL | Status: DC | PRN

## 2012-07-17 MED ORDER — SCOPOLAMINE 1 MG/3DAYS TD PT72
MEDICATED_PATCH | TRANSDERMAL | Status: AC
  Filled 2012-07-17: qty 1

## 2012-07-17 MED ORDER — MEPERIDINE HCL 25 MG/ML IJ SOLN
INTRAMUSCULAR | Status: AC
  Filled 2012-07-17: qty 1

## 2012-07-17 MED ORDER — NALOXONE HCL 0.4 MG/ML IJ SOLN: 0.4000 mg | INTRAMUSCULAR | Status: DC | PRN

## 2012-07-17 MED ORDER — GENTAMICIN SULFATE 40 MG/ML IJ SOLN
Freq: Three times a day (TID) | INTRAVENOUS | Status: DC
  Administered 2012-07-17 – 2012-07-18 (×4): via INTRAVENOUS
  Filled 2012-07-17 (×6): qty 4

## 2012-07-17 MED ORDER — KETOROLAC TROMETHAMINE 30 MG/ML IJ SOLN
30.0000 mg | Freq: Four times a day (QID) | INTRAMUSCULAR | Status: AC | PRN
  Administered 2012-07-17 (×2): 30 mg via INTRAVENOUS
  Filled 2012-07-17: qty 1

## 2012-07-17 MED ORDER — PHENYLEPHRINE HCL 10 MG/ML IJ SOLN
INTRAMUSCULAR | Status: DC | PRN
  Administered 2012-07-17 (×8): 40 ug via INTRAVENOUS

## 2012-07-17 MED ORDER — LANOLIN HYDROUS EX OINT: 1.0000 "application " | TOPICAL_OINTMENT | CUTANEOUS | Status: DC | PRN

## 2012-07-17 MED ORDER — LIDOCAINE-EPINEPHRINE (PF) 2 %-1:200000 IJ SOLN
INTRAMUSCULAR | Status: AC
  Filled 2012-07-17: qty 20

## 2012-07-17 MED ORDER — SODIUM CHLORIDE 0.9 % IV SOLN
1.0000 ug/kg/h | INTRAVENOUS | Status: DC | PRN
  Filled 2012-07-17: qty 2.5

## 2012-07-17 MED ORDER — ONDANSETRON HCL 4 MG/2ML IJ SOLN
4.0000 mg | Freq: Three times a day (TID) | INTRAMUSCULAR | Status: DC | PRN
  Administered 2012-07-17: 4 mg via INTRAVENOUS
  Filled 2012-07-17: qty 2

## 2012-07-17 MED ORDER — FENTANYL CITRATE 0.05 MG/ML IJ SOLN
INTRAMUSCULAR | Status: AC
  Filled 2012-07-17: qty 2

## 2012-07-17 MED ORDER — MORPHINE SULFATE (PF) 0.5 MG/ML IJ SOLN
INTRAMUSCULAR | Status: DC | PRN
Start: 2012-07-17 — End: 2012-07-17
  Administered 2012-07-17: 1 mg via INTRAVENOUS
  Administered 2012-07-17: 4 mg via EPIDURAL

## 2012-07-17 MED ORDER — PRENATAL MULTIVITAMIN CH
1.0000 | ORAL_TABLET | Freq: Every day | ORAL | Status: DC
  Administered 2012-07-18 – 2012-07-19 (×2): 1 via ORAL
  Filled 2012-07-17 (×2): qty 1

## 2012-07-17 MED ORDER — SIMETHICONE 80 MG PO CHEW
80.0000 mg | CHEWABLE_TABLET | Freq: Three times a day (TID) | ORAL | Status: DC
  Administered 2012-07-17 – 2012-07-18 (×4): 80 mg via ORAL

## 2012-07-17 MED ORDER — SODIUM BICARBONATE 8.4 % IV SOLN
INTRAVENOUS | Status: DC | PRN
  Administered 2012-07-17: 5 mL via EPIDURAL

## 2012-07-17 MED ORDER — ONDANSETRON HCL 4 MG PO TABS: 4.0000 mg | ORAL_TABLET | ORAL | Status: DC | PRN

## 2012-07-17 MED ORDER — ONDANSETRON HCL 4 MG/2ML IJ SOLN
INTRAMUSCULAR | Status: DC | PRN
  Administered 2012-07-17: 4 mg via INTRAVENOUS

## 2012-07-17 MED ORDER — CEFAZOLIN SODIUM-DEXTROSE 2-3 GM-% IV SOLR
2.0000 g | INTRAVENOUS | Status: AC
  Administered 2012-07-17: 2 g via INTRAVENOUS

## 2012-07-17 MED ORDER — IBUPROFEN 600 MG PO TABS
600.0000 mg | ORAL_TABLET | Freq: Four times a day (QID) | ORAL | Status: DC | PRN
  Filled 2012-07-17 (×7): qty 1

## 2012-07-17 MED ORDER — PHENYLEPHRINE 40 MCG/ML (10ML) SYRINGE FOR IV PUSH (FOR BLOOD PRESSURE SUPPORT)
PREFILLED_SYRINGE | INTRAVENOUS | Status: AC
  Filled 2012-07-17: qty 5

## 2012-07-17 MED ORDER — NALBUPHINE HCL 10 MG/ML IJ SOLN
5.0000 mg | INTRAMUSCULAR | Status: DC | PRN
  Filled 2012-07-17: qty 1

## 2012-07-17 MED ORDER — SIMETHICONE 80 MG PO CHEW: 80.0000 mg | CHEWABLE_TABLET | ORAL | Status: DC | PRN

## 2012-07-17 MED ORDER — TETANUS-DIPHTH-ACELL PERTUSSIS 5-2.5-18.5 LF-MCG/0.5 IM SUSP: 0.5000 mL | Freq: Once | INTRAMUSCULAR | Status: DC

## 2012-07-17 MED ORDER — MORPHINE SULFATE 0.5 MG/ML IJ SOLN
INTRAMUSCULAR | Status: AC
  Filled 2012-07-17: qty 10

## 2012-07-17 MED ORDER — WITCH HAZEL-GLYCERIN EX PADS: 1.0000 "application " | MEDICATED_PAD | CUTANEOUS | Status: DC | PRN

## 2012-07-17 MED ORDER — LACTATED RINGERS IV SOLN
INTRAVENOUS | Status: DC
  Administered 2012-07-17: 11:00:00 via INTRAVENOUS

## 2012-07-17 MED ORDER — OXYTOCIN 10 UNIT/ML IJ SOLN
40.0000 [IU] | INTRAVENOUS | Status: DC | PRN
  Administered 2012-07-17: 40 [IU] via INTRAVENOUS

## 2012-07-17 MED ORDER — METOCLOPRAMIDE HCL 5 MG/ML IJ SOLN: 10.0000 mg | Freq: Three times a day (TID) | INTRAMUSCULAR | Status: DC | PRN

## 2012-07-17 MED ORDER — OXYTOCIN 40 UNITS IN LACTATED RINGERS INFUSION - SIMPLE MED: 62.5000 mL/h | INTRAVENOUS | Status: AC

## 2012-07-17 MED ORDER — ONDANSETRON HCL 4 MG/2ML IJ SOLN
INTRAMUSCULAR | Status: AC
  Filled 2012-07-17: qty 2

## 2012-07-17 MED ORDER — LACTATED RINGERS IV SOLN
INTRAVENOUS | Status: DC | PRN
  Administered 2012-07-17: 03:00:00 via INTRAVENOUS

## 2012-07-17 MED ORDER — KETOROLAC TROMETHAMINE 30 MG/ML IJ SOLN: 30.0000 mg | Freq: Four times a day (QID) | INTRAMUSCULAR | Status: AC | PRN

## 2012-07-17 MED ORDER — FENTANYL CITRATE 0.05 MG/ML IJ SOLN
25.0000 ug | INTRAMUSCULAR | Status: DC | PRN
  Administered 2012-07-17: 50 ug via INTRAVENOUS

## 2012-07-17 MED ORDER — SENNOSIDES-DOCUSATE SODIUM 8.6-50 MG PO TABS
2.0000 | ORAL_TABLET | Freq: Every day | ORAL | Status: DC
  Administered 2012-07-17 – 2012-07-18 (×2): 2 via ORAL

## 2012-07-17 MED ORDER — OXYTOCIN 10 UNIT/ML IJ SOLN
INTRAMUSCULAR | Status: AC
  Filled 2012-07-17: qty 4

## 2012-07-17 MED ORDER — ONDANSETRON HCL 4 MG/2ML IJ SOLN: 4.0000 mg | INTRAMUSCULAR | Status: DC | PRN

## 2012-07-17 MED ORDER — DIPHENHYDRAMINE HCL 50 MG/ML IJ SOLN: 12.5000 mg | INTRAMUSCULAR | Status: DC | PRN

## 2012-07-17 MED ORDER — CLINDAMYCIN PHOSPHATE 900 MG/50ML IV SOLN: 900.0000 mg | Freq: Three times a day (TID) | INTRAVENOUS | Status: DC

## 2012-07-17 MED ORDER — CEFAZOLIN SODIUM-DEXTROSE 2-3 GM-% IV SOLR
INTRAVENOUS | Status: AC
  Filled 2012-07-17: qty 50

## 2012-07-17 MED ORDER — SODIUM BICARBONATE 8.4 % IV SOLN
INTRAVENOUS | Status: AC
  Filled 2012-07-17: qty 50

## 2012-07-17 MED ORDER — KETOROLAC TROMETHAMINE 30 MG/ML IJ SOLN
INTRAMUSCULAR | Status: AC
  Filled 2012-07-17: qty 1

## 2012-07-17 SURGICAL SUPPLY — 28 items
CHLORAPREP W/TINT 26ML (MISCELLANEOUS) ×2 IMPLANT
CONTAINER PREFILL 10% NBF 15ML (MISCELLANEOUS) IMPLANT
DRESSING TELFA 8X3 (GAUZE/BANDAGES/DRESSINGS) IMPLANT
DRSG COVADERM 4X10 (GAUZE/BANDAGES/DRESSINGS) ×2 IMPLANT
ELECT REM PT RETURN 9FT ADLT (ELECTROSURGICAL) ×2
ELECTRODE REM PT RTRN 9FT ADLT (ELECTROSURGICAL) ×1 IMPLANT
EXTRACTOR VACUUM M CUP 4 TUBE (SUCTIONS) IMPLANT
GAUZE SPONGE 4X4 12PLY STRL LF (GAUZE/BANDAGES/DRESSINGS) IMPLANT
GLOVE BIOGEL PI IND STRL 6.5 (GLOVE) ×2 IMPLANT
GLOVE BIOGEL PI INDICATOR 6.5 (GLOVE) ×2
GLOVE SURG SS PI 6.0 STRL IVOR (GLOVE) ×2 IMPLANT
GOWN PREVENTION PLUS LG XLONG (DISPOSABLE) ×6 IMPLANT
KIT ABG SYR 3ML LUER SLIP (SYRINGE) IMPLANT
NEEDLE HYPO 25X5/8 SAFETYGLIDE (NEEDLE) ×2 IMPLANT
NS IRRIG 1000ML POUR BTL (IV SOLUTION) ×2 IMPLANT
PACK C SECTION WH (CUSTOM PROCEDURE TRAY) ×2 IMPLANT
PAD ABD 7.5X8 STRL (GAUZE/BANDAGES/DRESSINGS) ×2 IMPLANT
PAD OB MATERNITY 4.3X12.25 (PERSONAL CARE ITEMS) IMPLANT
RTRCTR C-SECT PINK 25CM LRG (MISCELLANEOUS) IMPLANT
SEPRAFILM MEMBRANE 5X6 (MISCELLANEOUS) IMPLANT
SLEEVE SCD COMPRESS KNEE MED (MISCELLANEOUS) ×2 IMPLANT
STAPLER VISISTAT 35W (STAPLE) ×2 IMPLANT
SUT PLAIN 0 NONE (SUTURE) IMPLANT
SUT VIC AB 0 CT1 36 (SUTURE) ×8 IMPLANT
TAPE CLOTH SURG 4X10 WHT LF (GAUZE/BANDAGES/DRESSINGS) ×2 IMPLANT
TOWEL OR 17X24 6PK STRL BLUE (TOWEL DISPOSABLE) ×4 IMPLANT
TRAY FOLEY CATH 14FR (SET/KITS/TRAYS/PACK) IMPLANT
WATER STERILE IRR 1000ML POUR (IV SOLUTION) ×2 IMPLANT

## 2012-07-17 NOTE — Progress Notes (Signed)
Serum creatinine today was 0.59mg /dL.  Continue current gentamicin regimen.

## 2012-07-17 NOTE — Progress Notes (Signed)
Patient nauseated.  Unable to sit up in bed.  Unable to ambulate d/t nausea.  Pads changed in bed, medication given

## 2012-07-17 NOTE — Progress Notes (Signed)
Patient ID: Brenda Richards, female   DOB: 1981/03/25, 31 y.o.   MRN: [id number] Patient comfortable with epidural. Patient without cervical change x 4 hours with adequate contractions. Fetal status remains reassuring. Patient counseled on delivery via cesarean section. Risks, benefits and alternatives were explained including but not limited to risk of bleeding, infection and damage to adjacent organs. Patient verbalized understanding and all questions were answered. Consent was signed.

## 2012-07-17 NOTE — Op Note (Signed)
[name] PROCEDURE DATE: 07/17/2012  PREOPERATIVE DIAGNOSIS: Intrauterine pregnancy at  [redacted]w[redacted]d weeks gestation; failure to progress: arrest of dilation  POSTOPERATIVE DIAGNOSIS: The same  PROCEDURE:     Cesarean Section  SURGEON:  Dr. Catalina Antigua  ASSISTANT: none  INDICATIONS: Brenda Richards is a 31 y.o. G2P1001 at [redacted]w[redacted]d scheduled for cesarean section secondary to failure to progress: arrest of dilation at 8 cm, patient with adequate contraction for 4 hours.  The risks of cesarean section discussed with the patient included but were not limited to: bleeding which may require transfusion or reoperation; infection which may require antibiotics; injury to bowel, bladder, ureters or other surrounding organs; injury to the fetus; need for additional procedures including hysterectomy in the event of a life-threatening hemorrhage; placental abnormalities wth subsequent pregnancies, incisional problems, thromboembolic phenomenon and other postoperative/anesthesia complications. The patient concurred with the proposed plan, giving informed written consent for the procedure.    FINDINGS:  Viable female infant in cephalic presentation.  Apgars 8 and 9, weight- not available at time of writting this note.  Clear amniotic fluid.  Intact placenta, three vessel cord.  Normal uterus, fallopian tubes and ovaries bilaterally.  ANESTHESIA:    Spinal INTRAVENOUS FLUIDS:2000 ml ESTIMATED BLOOD LOSS: * No blood loss amount entered * ml URINE OUTPUT:  200 ml SPECIMENS: Placenta sent to pathology COMPLICATIONS: None immediate  PROCEDURE IN DETAIL:  The patient received intravenous antibiotics and had sequential compression devices applied to her lower extremities while in the preoperative area.  She was then taken to the operating room where anesthesia was induced and was found to be adequate. A foley catheter was placed into her bladder and attached to Mikaylah Libbey gravity. She was then placed in a  dorsal supine position with a leftward tilt, and prepped and draped in a sterile manner. After an adequate timeout was performed, a Pfannenstiel skin incision was made with scalpel and carried through to the underlying layer of fascia. The fascia was incised in the midline and this incision was extended bilaterally using the Mayo scissors. Kocher clamps were applied to the superior aspect of the fascial incision and the underlying rectus muscles were dissected off bluntly. A similar process was carried out on the inferior aspect of the facial incision. The rectus muscles were separated in the midline bluntly and the peritoneum was entered bluntly. The Alexis self-retaining retractor was introduced into the abdominal cavity. Attention was turned to the lower uterine segment where a bladder flap was created, and a transverse hysterotomy was made with a scalpel and extended bilaterally bluntly. The infant was successfully delivered, and cord was clamped and cut and infant was handed over to awaiting neonatology team. Uterine massage was then administered and the placenta delivered intact with three-vessel cord. The uterus was cleared of clot and debris.  The hysterotomy was closed with 0 Vicryl in a running locked fashion, and an imbricating layer was also placed with a 0 Vicryl. Overall, excellent hemostasis was noted. The pelvis copiously irrigated and cleared of all clot and debris. Hemostasis was confirmed on all surfaces.  The peritoneum and the muscles were reapproximated using 0 vicryl interrupted stitches. The fascia was then closed using 0 Vicryl in a running locked fashion.  The subcutaneous layer was reapproximated with plain gut and the skin was closed in a subcuticular fashion using 3.0 Vicryl. The patient tolerated the procedure well. Sponge, lap, instrument and needle counts were correct x 2. She was taken to the recovery room in stable  condition.    Cher Franzoni,PEGGYMD  07/17/2012 3:40 AM

## 2012-07-17 NOTE — Anesthesia Postprocedure Evaluation (Signed)
  Anesthesia Post-op Note  Patient: Brenda Richards  Procedure(s) Performed: Procedure(s) (LRB): CESAREAN SECTION (N/A)  Patient Location: PACU  Anesthesia Type: Epidural  Level of Consciousness: awake, alert  and oriented  Airway and Oxygen Therapy: Patient Spontanous Breathing  Post-op Pain: none  Post-op Assessment: Post-op Vital signs reviewed, Patient's Cardiovascular Status Stable, Respiratory Function Stable, Patent Airway, No signs of Nausea or vomiting, Pain level controlled, No headache, No backache, No residual numbness and No residual motor weakness  Post-op Vital Signs: Reviewed and stable  Complications: No apparent anesthesia complications

## 2012-07-17 NOTE — Progress Notes (Signed)
UR chart review completed.  

## 2012-07-17 NOTE — Addendum Note (Signed)
Addendum  created 07/17/12 0817 by Suella Grove, CRNA   Modules edited:Notes Section

## 2012-07-17 NOTE — Anesthesia Postprocedure Evaluation (Signed)
  Anesthesia Post-op Note  Patient: Brenda Richards  Procedure(s) Performed: Procedure(s) (LRB): CESAREAN SECTION (N/A)  Patient Location: Mother/Baby  Anesthesia Type: Epidural  Level of Consciousness: awake and alert   Airway and Oxygen Therapy: Patient Spontanous Breathing  Post-op Pain: none  Post-op Assessment: Patient's Cardiovascular Status Stable, Respiratory Function Stable, Patent Airway, No signs of Nausea or vomiting, Adequate PO intake, Pain level controlled, No headache, No backache, No residual numbness and No residual motor weakness  Post-op Vital Signs: Reviewed and stable  Complications: No apparent anesthesia complications

## 2012-07-17 NOTE — Transfer of Care (Signed)
Immediate Anesthesia Transfer of Care Note  Patient: Brenda Richards  Procedure(s) Performed: Procedure(s) (LRB): CESAREAN SECTION (N/A)  Patient Location: PACU  Anesthesia Type: Epidural  Level of Consciousness: awake, alert , oriented and patient cooperative  Airway & Oxygen Therapy: Patient Spontanous Breathing  Post-op Assessment: Report given to PACU RN and Post -op Vital signs reviewed and stable  Post vital signs: Reviewed and stable  Complications: No apparent anesthesia complications

## 2012-07-18 LAB — CBC
HCT: 28.1 % — ABNORMAL LOW (ref 36.0–46.0)
Hemoglobin: 9.1 g/dL — ABNORMAL LOW (ref 12.0–15.0)
MCH: 26.1 pg (ref 26.0–34.0)
MCHC: 32.7 g/dL (ref 30.0–36.0)
MCV: 79.8 fL (ref 78.0–100.0)
Platelets: 201 10*3/uL (ref 150–400)
RBC: 3.52 MIL/uL — ABNORMAL LOW (ref 3.87–5.11)
RDW: 14.8 % (ref 11.5–15.5)
WBC: 15.1 10*3/uL — ABNORMAL HIGH (ref 4.0–10.5)

## 2012-07-18 NOTE — Progress Notes (Signed)
Subjective: Postpartum Day 1: Cesarean Delivery Patient reports incisional pain, tolerating PO, + flatus and no problems voiding.    Objective: Vital signs in last 24 hours: Temp:  [97.6 F (36.4 C)-99.4 F (37.4 C)] 97.6 F (36.4 C) (08/31 0509) Pulse Rate:  [72-110] 72  (08/31 0509) Resp:  [16-22] 20  (08/31 0509) BP: (90-124)/(54-80) 90/54 mmHg (08/31 0509) SpO2:  [94 %-97 %] 97 % (08/30 2300)  Physical Exam:  General: alert, cooperative and no distress Lochia: appropriate Uterine Fundus: firm Incision: no significant drainage, dressing in place, small amt dried blood. DVT Evaluation: No evidence of DVT seen on physical exam.   Basename 07/18/12 0053 07/16/12 0700  HGB 9.1* 12.0  HCT 28.1* 36.8    Assessment/Plan: Status post Cesarean section. Doing well postoperatively. Continue current care. Anemia:  Hgb 12 --> 9.1 Start Ferrous sulfate. Asymptomatic. Bottle feeding. Plans to use patch for contraception.   Napoleon Form 07/18/2012, 7:01 AM

## 2012-07-18 NOTE — Addendum Note (Signed)
Addendum  created 07/18/12 0815 by Renford Dills, CRNA   Modules edited:Notes Section

## 2012-07-18 NOTE — Anesthesia Postprocedure Evaluation (Signed)
  Anesthesia Post-op Note  Patient: Brenda Richards  Procedure(s) Performed: Procedure(s) (LRB): CESAREAN SECTION (N/A)  Patient Location: Mother/Baby  Anesthesia Type: Epidural  Level of Consciousness: awake  Airway and Oxygen Therapy: Patient Spontanous Breathing  Post-op Pain: mild  Post-op Assessment: Patient's Cardiovascular Status Stable and Respiratory Function Stable  Post-op Vital Signs: stable  Complications: No apparent anesthesia complications

## 2012-07-19 MED ORDER — OXYCODONE-ACETAMINOPHEN 5-325 MG PO TABS: 1.0000 | ORAL_TABLET | ORAL | Status: AC | PRN

## 2012-07-19 MED ORDER — IBUPROFEN 600 MG PO TABS: 600.0000 mg | ORAL_TABLET | Freq: Four times a day (QID) | ORAL | Status: AC | PRN

## 2012-07-19 NOTE — Discharge Summary (Signed)
Obstetric Discharge Summary Reason for Admission: onset of labor Prenatal Procedures: ultrasound Intrapartum Procedures: cesarean: low cervical, vertical and amp/gent for intrapartum fever  PLTCS for FTP, arrested at 8cm Postpartum Procedures: none Complications-Operative and Postpartum: none Hemoglobin  Date Value Range Status  07/18/2012 9.1* 12.0 - 15.0 g/dL Final     REPEATED TO VERIFY     DELTA CHECK NOTED     HCT  Date Value Range Status  07/18/2012 28.1* 36.0 - 46.0 % Final    Physical Exam:  General: alert, cooperative, appears stated age and no distress Lochia: appropriate Uterine Fundus: firm, u/2 Incision: healing well, no significant drainage, no dehiscence, no significant erythema- no staples DVT Evaluation: No evidence of DVT seen on physical exam. Negative Homan's sign. No cords or calf tenderness. No significant calf/ankle edema.  Denies SOB, dizziness, CP, excessive fatigue  Discharge Diagnoses: Term Pregnancy-delivered, s/p PLTCS for FTP, suspected intrapartum chorioamnionits- treated w/ amp/gent Asymptomatic anemia- will resume prenatal vitamins  Discharge Information: Date: 07/19/2012 Activity: pelvic rest Diet: routine Medications: Ibuprofen and Percocet Condition: stable Instructions: refer to practice specific booklet Discharge to: home Follow-up Information    Follow up with [facility] [place]. Schedule an appointment as soon as possible for a visit in 6 weeks. (If you have any problems before your 6 week visit at the health department, return to [facility] hospital)    Contact information:   [address] [facility] [address]         Requesting to go home today, POD#2.  OK per Dr. Debroah Loop.    Newborn Data: Live born female  Birth Weight: 7 lb 15.5 oz (3615 g) APGAR: 8, 9  Home with mother.  Marge Duncans 07/19/2012, 8:16 AM

## 2012-07-20 ENCOUNTER — Encounter (HOSPITAL_COMMUNITY): Payer: Self-pay | Admitting: Obstetrics and Gynecology

## 2012-07-24 ENCOUNTER — Inpatient Hospital Stay (HOSPITAL_COMMUNITY): Admission: RE | Admit: 2012-07-24 | Payer: Medicaid Other | Source: Ambulatory Visit

## 2012-08-06 NOTE — MAU Provider Note (Signed)
Attestation of Attending Supervision of Advanced Practitioner: Evaluation and management procedures were performed by the PA/NP/CNM/OB Fellow under my supervision/collaboration. Chart reviewed and agree with management and plan.We discussed patient management during evaluation process.  Hridaan Bouse V 08/06/2012 8:45 PM

## 2014-09-19 ENCOUNTER — Encounter (HOSPITAL_COMMUNITY): Payer: Self-pay | Admitting: Obstetrics and Gynecology

## 2014-11-18 NOTE — L&D Delivery Note (Signed)
Delivery Note  Called to patient's room as patient was having a lot of pressure.  Bulging bag at the introitus.  US revealed head in the vagina.  Took the patient out of trendelenburg and pushed once to deliver baby.   At 2:57 AM a viable female was delivered via Vaginal, Spontaneous Delivery (Presentation: cervical).  APGAR: pending; weight 1 pound 7.3 ounces.   Placenta status: Intact, Spontaneous.  Delivered after cytotec  PO.  Cord: 3vc.  With the following complications: NICU present due to the severe preterm status of the baby.  Please see their note for resuscitation details.  Cord gas not obtained at delivery due to reassuring FHT at delivery.    Anesthesia: None  Episiotomy: None Lacerations: None Est. Blood Loss (mL): 50  Mom to postpartum.  Baby to NICU.  STINSON, JACOB JEHIEL 08/29/2015, 3:27 AM

## 2015-06-09 LAB — OB RESULTS CONSOLE HGB/HCT, BLOOD
HCT: 37 %
Hemoglobin: 12.1 g/dL

## 2015-06-09 LAB — OB RESULTS CONSOLE RUBELLA ANTIBODY, IGM: Rubella: IMMUNE

## 2015-06-09 LAB — OB RESULTS CONSOLE PLATELET COUNT: Platelets: 324 10*3/uL

## 2015-06-09 LAB — OB RESULTS CONSOLE ANTIBODY SCREEN: Antibody Screen: NEGATIVE

## 2015-06-09 LAB — OB RESULTS CONSOLE HIV ANTIBODY (ROUTINE TESTING): HIV: NONREACTIVE

## 2015-06-09 LAB — OB RESULTS CONSOLE ABO/RH: RH Type: POSITIVE

## 2015-06-09 LAB — CYSTIC FIBROSIS DIAGNOSTIC STUDY: Interpretation-CFDNA:: NEGATIVE

## 2015-06-09 LAB — OB RESULTS CONSOLE HEPATITIS B SURFACE ANTIGEN: Hepatitis B Surface Ag: NEGATIVE

## 2015-07-06 ENCOUNTER — Other Ambulatory Visit: Payer: Self-pay | Admitting: Advanced Practice Midwife

## 2015-07-06 ENCOUNTER — Encounter: Payer: Self-pay | Admitting: Advanced Practice Midwife

## 2015-07-06 ENCOUNTER — Ambulatory Visit (INDEPENDENT_AMBULATORY_CARE_PROVIDER_SITE_OTHER): Payer: Self-pay | Admitting: Advanced Practice Midwife

## 2015-07-06 VITALS — BP 111/66 | HR 81 | Temp 98.2°F | Wt 182.2 lb

## 2015-07-06 DIAGNOSIS — O3421 Maternal care for scar from previous cesarean delivery: Secondary | ICD-10-CM

## 2015-07-06 DIAGNOSIS — K219 Gastro-esophageal reflux disease without esophagitis: Secondary | ICD-10-CM

## 2015-07-06 DIAGNOSIS — Z3490 Encounter for supervision of normal pregnancy, unspecified, unspecified trimester: Principal | ICD-10-CM

## 2015-07-06 DIAGNOSIS — Z3492 Encounter for supervision of normal pregnancy, unspecified, second trimester: Secondary | ICD-10-CM

## 2015-07-06 DIAGNOSIS — Z3480 Encounter for supervision of other normal pregnancy, unspecified trimester: Secondary | ICD-10-CM

## 2015-07-06 DIAGNOSIS — O34219 Maternal care for unspecified type scar from previous cesarean delivery: Secondary | ICD-10-CM | POA: Insufficient documentation

## 2015-07-06 DIAGNOSIS — O219 Vomiting of pregnancy, unspecified: Secondary | ICD-10-CM | POA: Insufficient documentation

## 2015-07-06 LAB — POCT URINALYSIS DIP (DEVICE)
Bilirubin Urine: NEGATIVE
Glucose, UA: NEGATIVE mg/dL
Hgb urine dipstick: NEGATIVE
Ketones, ur: NEGATIVE mg/dL
Leukocytes, UA: NEGATIVE
Nitrite: NEGATIVE
Protein, ur: 30 mg/dL — AB
Specific Gravity, Urine: 1.02 (ref 1.005–1.030)
Urobilinogen, UA: 0.2 mg/dL (ref 0.0–1.0)
pH: 8.5 — ABNORMAL HIGH (ref 5.0–8.0)

## 2015-07-06 MED ORDER — RANITIDINE HCL 75 MG PO TABS: 75.0000 mg | ORAL_TABLET | Freq: Two times a day (BID) | ORAL | Status: DC

## 2015-07-06 MED ORDER — MECLIZINE HCL 25 MG PO CHEW
1.0000 | CHEWABLE_TABLET | Freq: Two times a day (BID) | ORAL | Status: DC | PRN
Start: 2015-07-06 — End: 2015-08-28

## 2015-07-06 NOTE — Progress Notes (Signed)
   Subjective:    Brenda Richards is a [id number] [redacted]w[redacted]d being seen today for her first obstetrical visit.  Her obstetrical history is significant for obesity and history of Oligohydramnios with last pregnancy, history C/S for FTP last pregnancy. Patient does intend to breast feed. Pregnancy history fully reviewed.  Had complete new OB visit in [place], [place]. Including pap and cultures. Waiting on records, re-requested today.   Patient reports no complaints.  Filed Vitals:   07/06/15 1340  BP: 111/66  Pulse: 81  Temp: 98.2 F (36.8 C)  Weight: 182 lb 3.2 oz (82.645 kg)    HISTORY: OB History  Gravida Para Term Preterm AB SAB TAB Ectopic Multiple Living  0  0 0 0 0 2    # Outcome Date GA Lbr Len/2nd Weight Sex Delivery Anes PTL Lv  3 Current           2 Term 07/17/12 [redacted]w[redacted]d  7 lb 15.5 oz (3.615 kg) M CS-LVertical EPI  Y  1 Term 2004 [redacted]w[redacted]d 10:00 7 lb (3.175 kg) M Vag-Spont EPI  Y     Comments: oligohydramnios     Past Medical History  Diagnosis Date  . No pertinent past medical history   . Positive PPD, treated    Past Surgical History  Procedure Laterality Date  . No past surgeries    . Cesarean section  07/17/2012    Procedure: CESAREAN SECTION;  Surgeon: Catalina Antigua, MD;  Location: [facility];  Service: Gynecology;  Laterality: N/A;   Family History  Problem Relation Age of Onset  . Anesthesia problems Neg Hx   . Diabetes Mother      Exam    Uterus:  Fundal Height: 16 cm  Pelvic Exam:    Perineum: See prior pelvic exam from [facility]   Vulva: Not examined   Vagina:  N/a   pH:    Cervix: n/a   Adnexa: not evaluated   Bony Pelvis: gynecoid  System: Breast:  normal appearance, no masses or tenderness   Skin: normal coloration and turgor, no rashes    Neurologic: oriented, grossly non-focal   Extremities: normal strength, tone, and muscle mass   HEENT neck supple with midline trachea   Mouth/Teeth mucous membranes moist, pharynx normal  without lesions   Neck supple and no masses   Cardiovascular: regular rate and rhythm, no murmurs or gallops   Respiratory:  appears well, vitals normal, no respiratory distress, acyanotic, normal RR, ear and throat exam is normal, neck free of mass or lymphadenopathy, chest clear, no wheezing, crepitations, rhonchi, normal symmetric air entry   Abdomen: soft, non-tender; bowel sounds normal; no masses,  no organomegaly   Urinary: n/a      Assessment:    Pregnancy: [id number] Patient Active Problem List   Diagnosis Date Noted  . Previous cesarean delivery affecting pregnancy, antepartum 07/06/2015        Plan:     Initial labs drawn. Prenatal vitamins. Problem list reviewed and updated. Genetic Screening discussed Quad Screen: declined.  Ultrasound discussed; fetal survey: ordered.  Follow up in 4 weeks. 50% of 30 min visit spent on counseling and coordination of care.     [facility] 07/06/2015

## 2015-07-06 NOTE — Addendum Note (Signed)
Addended by: Aviva Signs on: 07/06/2015 06:22 PM   Modules accepted: Orders

## 2015-07-06 NOTE — Progress Notes (Signed)
Here for first visit. Had started prenatal care in [place]. Given prenatal education booklets.

## 2015-07-06 NOTE — Patient Instructions (Signed)
Second Trimester of Pregnancy The second trimester is from week 13 through week 28, months 4 through 6. The second trimester is often a time when you feel your best. Your body has also adjusted to being pregnant, and you begin to feel better physically. Usually, morning sickness has lessened or quit completely, you may have more energy, and you may have an increase in appetite. The second trimester is also a time when the fetus is growing rapidly. At the end of the sixth month, the fetus is about 9 inches long and weighs about 1 pounds. You will likely begin to feel the baby move (quickening) between 18 and 20 weeks of the pregnancy. BODY CHANGES Your body goes through many changes during pregnancy. The changes vary from woman to woman.   Your weight will continue to increase. You will notice your lower abdomen bulging out.  You may begin to get stretch marks on your hips, abdomen, and breasts.  You may develop headaches that can be relieved by medicines approved by your health care provider.  You may urinate more often because the fetus is pressing on your bladder.  You may develop or continue to have heartburn as a result of your pregnancy.  You may develop constipation because certain hormones are causing the muscles that push waste through your intestines to slow down.  You may develop hemorrhoids or swollen, bulging veins (varicose veins).  You may have back pain because of the weight gain and pregnancy hormones relaxing your joints between the bones in your pelvis and as a result of a shift in weight and the muscles that support your balance.  Your breasts will continue to grow and be tender.  Your gums may bleed and may be sensitive to brushing and flossing.  Dark spots or blotches (chloasma, mask of pregnancy) may develop on your face. This will likely fade after the baby is born.  A dark line from your belly button to the pubic area (linea nigra) may appear. This will likely fade  after the baby is born.  You may have changes in your hair. These can include thickening of your hair, rapid growth, and changes in texture. Some women also have hair loss during or after pregnancy, or hair that feels dry or thin. Your hair will most likely return to normal after your baby is born. WHAT TO EXPECT AT YOUR PRENATAL VISITS During a routine prenatal visit:  You will be weighed to make sure you and the fetus are growing normally.  Your blood pressure will be taken.  Your abdomen will be measured to track your baby's growth.  The fetal heartbeat will be listened to.  Any test results from the previous visit will be discussed. Your health care provider may ask you:  How you are feeling.  If you are feeling the baby move.  If you have had any abnormal symptoms, such as leaking fluid, bleeding, severe headaches, or abdominal cramping.  If you have any questions. Other tests that may be performed during your second trimester include:  Blood tests that check for:  Low iron levels (anemia).  Gestational diabetes (between 24 and 28 weeks).  Rh antibodies.  Urine tests to check for infections, diabetes, or protein in the urine.  An ultrasound to confirm the proper growth and development of the baby.  An amniocentesis to check for possible genetic problems.  Fetal screens for spina bifida and Down syndrome. HOME CARE INSTRUCTIONS   Avoid all smoking, herbs, alcohol, and unprescribed   drugs. These chemicals affect the formation and growth of the baby.  Follow your health care provider's instructions regarding medicine use. There are medicines that are either safe or unsafe to take during pregnancy.  Exercise only as directed by your health care provider. Experiencing uterine cramps is a good sign to stop exercising.  Continue to eat regular, healthy meals.  Wear a good support bra for breast tenderness.  Do not use hot tubs, steam rooms, or saunas.  Wear your  seat belt at all times when driving.  Avoid raw meat, uncooked cheese, cat litter boxes, and soil used by cats. These carry germs that can cause birth defects in the baby.  Take your prenatal vitamins.  Try taking a stool softener (if your health care provider approves) if you develop constipation. Eat more high-fiber foods, such as fresh vegetables or fruit and whole grains. Drink plenty of fluids to keep your urine clear or pale yellow.  Take warm sitz baths to soothe any pain or discomfort caused by hemorrhoids. Use hemorrhoid cream if your health care provider approves.  If you develop varicose veins, wear support hose. Elevate your feet for 15 minutes, 3-4 times a day. Limit salt in your diet.  Avoid heavy lifting, wear low heel shoes, and practice good posture.  Rest with your legs elevated if you have leg cramps or low back pain.  Visit your dentist if you have not gone yet during your pregnancy. Use a soft toothbrush to brush your teeth and be gentle when you floss.  A sexual relationship may be continued unless your health care provider directs you otherwise.  Continue to go to all your prenatal visits as directed by your health care provider. SEEK MEDICAL CARE IF:   You have dizziness.  You have mild pelvic cramps, pelvic pressure, or nagging pain in the abdominal area.  You have persistent nausea, vomiting, or diarrhea.  You have a bad smelling vaginal discharge.  You have pain with urination. SEEK IMMEDIATE MEDICAL CARE IF:   You have a fever.  You are leaking fluid from your vagina.  You have spotting or bleeding from your vagina.  You have severe abdominal cramping or pain.  You have rapid weight gain or loss.  You have shortness of breath with chest pain.  You notice sudden or extreme swelling of your face, hands, ankles, feet, or legs.  You have not felt your baby move in over an hour.  You have severe headaches that do not go away with  medicine.  You have vision changes. Document Released: 10/29/2001 Document Revised: 11/09/2013 Document Reviewed: 01/05/2013 ExitCare Patient Information 2015 [employer]. This information is not intended to replace advice given to you by your health care provider. Make sure you discuss any questions you have with your health care provider.  

## 2015-07-07 LAB — PRESCRIPTION MONITORING PROFILE (19 PANEL)
Amphetamine/Meth: NEGATIVE ng/mL
Barbiturate Screen, Urine: NEGATIVE ng/mL
Benzodiazepine Screen, Urine: NEGATIVE ng/mL
Buprenorphine, Urine: NEGATIVE ng/mL
Cannabinoid Scrn, Ur: NEGATIVE ng/mL
Carisoprodol, Urine: NEGATIVE ng/mL
Cocaine Metabolites: NEGATIVE ng/mL
Creatinine, Urine: 141.74 mg/dL (ref >20.0)
Fentanyl, Ur: NEGATIVE ng/mL
MDMA URINE: NEGATIVE ng/mL
Meperidine, Ur: NEGATIVE ng/mL
Methadone Screen, Urine: NEGATIVE ng/mL
Methaqualone: NEGATIVE ng/mL
Nitrites, Initial: NEGATIVE ug/mL
Opiate Screen, Urine: NEGATIVE ng/mL
Oxycodone Screen, Ur: NEGATIVE ng/mL
Phencyclidine, Ur: NEGATIVE ng/mL
Propoxyphene: NEGATIVE ng/mL
Tapentadol, urine: NEGATIVE ng/mL
Tramadol Scrn, Ur: NEGATIVE ng/mL
Zolpidem, Urine: NEGATIVE ng/mL
pH, Initial: 7.8 pH (ref 4.5–8.9)

## 2015-07-10 ENCOUNTER — Telehealth: Payer: Self-pay

## 2015-07-10 LAB — GLUCOSE TOLERANCE, 1 HOUR (50G) W/O FASTING: Glucose, 1 Hour GTT: 82 mg/dL (ref 70–140)

## 2015-07-10 NOTE — Telephone Encounter (Signed)
[facility] called to clarify order from 07/06/15 for 1 hr GTT.

## 2015-07-13 ENCOUNTER — Telehealth: Payer: Self-pay

## 2015-07-13 NOTE — Telephone Encounter (Signed)
Patient called regarding medical records, her previous office sent her a letter about them having issues sending her records to [place]. I called the office to find out what the issue was. Her next appt was in 2 weeks and patient was concerned about having her records here by that point. Office assured me they would send what they had, they sent them originally to the wrong place according to [place] who I spoke to. Called patient and advised her what happened.

## 2015-07-27 ENCOUNTER — Encounter: Payer: Self-pay | Admitting: *Deleted

## 2015-08-01 ENCOUNTER — Encounter: Payer: Self-pay | Admitting: Advanced Practice Midwife

## 2015-08-04 ENCOUNTER — Ambulatory Visit (HOSPITAL_COMMUNITY)
Admission: RE | Admit: 2015-08-04 | Discharge: 2015-08-04 | Disposition: A | Discharge status: Home / Self Care | Payer: Self-pay | Source: Ambulatory Visit | Attending: Advanced Practice Midwife | Admitting: Advanced Practice Midwife

## 2015-08-04 ENCOUNTER — Ambulatory Visit (HOSPITAL_COMMUNITY): Payer: Self-pay

## 2015-08-04 DIAGNOSIS — Z3480 Encounter for supervision of other normal pregnancy, unspecified trimester: Secondary | ICD-10-CM

## 2015-08-04 DIAGNOSIS — Z3490 Encounter for supervision of normal pregnancy, unspecified, unspecified trimester: Secondary | ICD-10-CM | POA: Insufficient documentation

## 2015-08-04 DIAGNOSIS — O34219 Maternal care for unspecified type scar from previous cesarean delivery: Secondary | ICD-10-CM

## 2015-08-08 ENCOUNTER — Ambulatory Visit (INDEPENDENT_AMBULATORY_CARE_PROVIDER_SITE_OTHER): Payer: Self-pay | Admitting: Family Medicine

## 2015-08-08 VITALS — BP 116/58 | HR 77 | Temp 98.3°F | Wt 182.9 lb

## 2015-08-08 DIAGNOSIS — Z3490 Encounter for supervision of normal pregnancy, unspecified, unspecified trimester: Secondary | ICD-10-CM | POA: Insufficient documentation

## 2015-08-08 DIAGNOSIS — Z3482 Encounter for supervision of other normal pregnancy, second trimester: Secondary | ICD-10-CM

## 2015-08-08 DIAGNOSIS — O34219 Maternal care for unspecified type scar from previous cesarean delivery: Secondary | ICD-10-CM

## 2015-08-08 DIAGNOSIS — K219 Gastro-esophageal reflux disease without esophagitis: Secondary | ICD-10-CM

## 2015-08-08 DIAGNOSIS — O3421 Maternal care for scar from previous cesarean delivery: Secondary | ICD-10-CM

## 2015-08-08 DIAGNOSIS — O219 Vomiting of pregnancy, unspecified: Secondary | ICD-10-CM

## 2015-08-08 LAB — POCT URINALYSIS DIP (DEVICE)
Bilirubin Urine: NEGATIVE
Glucose, UA: NEGATIVE mg/dL
Ketones, ur: NEGATIVE mg/dL
Leukocytes, UA: NEGATIVE
Nitrite: NEGATIVE
Protein, ur: NEGATIVE mg/dL
Specific Gravity, Urine: 1.02 (ref 1.005–1.030)
Urobilinogen, UA: 0.2 mg/dL (ref 0.0–1.0)
pH: 7 (ref 5.0–8.0)

## 2015-08-08 NOTE — Progress Notes (Signed)
Hgb: small Breastfeeding tip of the week reviewed Declined flu vaccine

## 2015-08-08 NOTE — Patient Instructions (Signed)
Parto vaginal despus de una cesrea (Vaginal Birth After Cesarean Delivery) Un parto vaginal despus de un parto por cesrea es dar a luz por la vagina despus de haber dado a luz por medio de una intervencin quirrgica. En el pasado, si una mujer tena un beb por cesrea, todos los partos posteriores deban hacerse por cesrea. Esto ya no es as. Puede ser seguro para la mam intentar un parto vaginal luego de una cesrea.  Es importante que converse con su mdico desde comienzos del embarazo de modo que pueda comprender los riesgos, beneficios y opciones. Le dar tiempo para decidir qu es lo mejor en su caso particular. La decisin final de tener un parto vaginal o por cesrea debe tomarse en conjunto, entre usted y el mdico. Cualquier cambio en su salud o la de su beb durante el embarazo puede ser motivo de un cambio de decisin respecto del parto vaginal.  LAS MUJERES QUE OPTAN POR EL PARTO VAGINAL, DEBEN CONSULTAR AL MDICO PARA ASEGURARSE DE QUE:  La cesrea anterior se haya realizado con un corte (incisin) uterino transversal (no con una incisin vertical clsica).  El canal de parto es lo suficientemente grande como para que pase el nio.  No ha sido sometida a otras operaciones del tero.  Durante el trabajo de parto, le realizarn un monitoreo fetal electrnico, en todo momento.  Habr un quirfano disponible y listo en caso de necesitar una cesrea de emergencia.  Un mdico y personal de quirfano estarn disponibles en todo momento durante el trabajo de parto, para realizar una cesrea en caso de ser necesario.  Habr un anestesista disponible en caso de necesitar una cesrea de emergencia.  La nursery est lista y cuenta con personal especializado y el equipo disponible para cuidar al beb en caso de emergencia. BENEFICIOS DEL PARTO VAGINAL:  Permanencia ms breve en el hospital.  Prevencin de los riesgos asociados con el parto por cesrea, por ejemplo:  Complicaciones  quirrgicas, como apertura o hernia de la incisin.  Lesiones en otros rganos.  Fiebre. Esto puede ocurrir si aparece una infeccin despus de la ciruga. Tambin puede ocurrir como reaccin a los medicamentos administrados para adormecerla durante la ciruga.  Menos prdida de sangre y menos probabilidad de necesitar una transfusin sangunea.  Menor riesgo de cogulos sanguneos e infeccin.  Tiempo ms corto de recuperacin.  Menor riesgo de remocin del tero (histerectoma).  Menor riesgo de que la placenta cubra parcial o completamente la abertura del tero (placenta previa) en embarazos futuros.  Menos riesgos en el trabajo de parto y el parto futuros. RIESGOS  Ruptura del tero. Esto ocurre en menos del 1% de los partos vaginales. El riesgo de que eso suceda es mayor si:  Se toman medidas para iniciar el proceso del trabajo de parto (inducir el parto) o estimular o intensificar las contracciones (aumentar el trabajo de parto).  Se usan medicamentos para ablandar (madurar) el cuello del tero.  Es necesario extraer el tero (histerectoma) si se rompe. No debe llevarse a cabo si:  La cesrea previa se realiz con una incisin vertical (clsica) o con forma de T, o usted no sabe cul de ellas le han practicado.  Ha sufrido ruptura del tero.  Ha tenido ciertos tipos de ciruga en el tero, como la extirpacin de fibromas uterinos. Pregntele a su mdico sobre otros tipos de cirugas que le impiden tener un parto vaginal.  Tiene ciertos problemas mdicos o relacionados con el parto (obsttricos).  El beb est en   problemas.  Tuvo dos cesreas previas y ningn parto vaginal. OTRAS COSAS QUE DEBE SABER:  La anestesia peridural es segura.  Es seguro dar vuelta al beb si se encuentra de nalgas (intentar una versin ceflica externa).  Es seguro intentarlo en caso de mellizos.  El parto vaginal puede no ser apropiado si el beb pesa 8,8lb (4kg) o ms. Sin embargo,  las predicciones de peso no son siempre exactas y no deben ser lo nico a tenerse en cuenta para decidir si el parto vaginal es lo indicado para usted.  Hay aumento en el porcentaje de fracasos si el intervalo entre la cesrea y el parto vaginal es de menos de 19 meses.  Su mdico puede aconsejarle no tener un parto vaginal si tiene preeclampsia (hipertensin, protena en la orina e hinchazn en la cara y las extremidades).  El parto vaginal suele ser exitoso si ya tuvo un parto vaginal previamente.  Tambin suele ser exitoso cuando el trabajo de parto comienza espontneamente antes de la fecha.  El parto vaginal despus de una cesrea es similar a un parto espontneo vaginal normal. Document Released: 04/22/2008 Document Revised: 08/25/2013 ExitCare Patient Information 2015 [employer]. This information is not intended to replace advice given to you by your health care provider. Make sure you discuss any questions you have with your health care provider.  

## 2015-08-08 NOTE — Progress Notes (Signed)
Subjective:  Brenda Richards is a 34 y.o. G3P2002 at [redacted]w[redacted]d being seen today for ongoing prenatal care.  Patient reports no complaints.  Contractions: Not present.  Vag. Bleeding: None. Movement: Absent. Denies leaking of fluid.   The following portions of the patient's history were reviewed and updated as appropriate: allergies, current medications, past family history, past medical history, past social history, past surgical history and problem list.   Objective:   Filed Vitals:   08/08/15 1502  BP: 116/58  Pulse: 77  Temp: 98.3 F (36.8 C)  Weight: 182 lb 14.4 oz (82.963 kg)    Fetal Status: Fetal Heart Rate (bpm): 140   Movement: Absent     General:  Alert, oriented and cooperative. Patient is in no acute distress.  Skin: Skin is warm and dry. No rash noted.   Cardiovascular: Normal heart rate noted  Respiratory: Normal respiratory effort, no problems with respiration noted  Abdomen: Soft, gravid, appropriate for gestational age. Pain/Pressure: Present     Pelvic: Vag. Bleeding: None     Cervical exam deferred        Extremities: Normal range of motion.  Edema: None  Mental Status: Normal mood and affect. Normal behavior. Normal judgment and thought content.   Urinalysis: Urine Protein: Negative Urine Glucose: Negative  Assessment and Plan:  Pregnancy: G3P2002 at [redacted]w[redacted]d  1. Previous cesarean delivery affecting pregnancy, antepartum -discussed TOLAC- proven to 7#, CS~91yrs ago for arrest of dilation at 8 cm -chance of success 57% (plus/minus 5) and signed consent today  2. Nausea/vomiting in pregnancy -Improving but still present  3. Gastroesophageal reflux disease without esophagitis -Likely contributes to nausea  4. Supervision of normal pregnancy, antepartum, second trimester -updated pregnancy overview -Discussed mode of delivery  Preterm labor symptoms and general obstetric precautions including but not limited to vaginal bleeding, contractions, leaking of  fluid and fetal movement were reviewed in detail with the patient. Please refer to After Visit Summary for other counseling recommendations.  Return in about 4 weeks (around 09/05/2015) for Routine prenatal care.   Federico Flake, MD

## 2015-08-09 ENCOUNTER — Encounter: Payer: Self-pay | Admitting: *Deleted

## 2015-08-28 ENCOUNTER — Inpatient Hospital Stay (HOSPITAL_COMMUNITY): Payer: Medicaid Other

## 2015-08-28 ENCOUNTER — Inpatient Hospital Stay (HOSPITAL_COMMUNITY)
Admission: AD | Admit: 2015-08-28 | Discharge: 2015-08-31 | DRG: 775 | Disposition: A | Discharge status: Home / Self Care | Payer: Medicaid Other | Source: Ambulatory Visit | Attending: Family Medicine | Admitting: Family Medicine

## 2015-08-28 ENCOUNTER — Telehealth: Payer: Self-pay | Admitting: *Deleted

## 2015-08-28 ENCOUNTER — Encounter (HOSPITAL_COMMUNITY): Payer: Self-pay | Admitting: *Deleted

## 2015-08-28 DIAGNOSIS — O3432 Maternal care for cervical incompetence, second trimester: Secondary | ICD-10-CM | POA: Diagnosis present

## 2015-08-28 DIAGNOSIS — N883 Incompetence of cervix uteri: Secondary | ICD-10-CM | POA: Diagnosis present

## 2015-08-28 DIAGNOSIS — Z833 Family history of diabetes mellitus: Secondary | ICD-10-CM

## 2015-08-28 DIAGNOSIS — Z8751 Personal history of pre-term labor: Secondary | ICD-10-CM

## 2015-08-28 DIAGNOSIS — O34219 Maternal care for unspecified type scar from previous cesarean delivery: Secondary | ICD-10-CM

## 2015-08-28 DIAGNOSIS — O219 Vomiting of pregnancy, unspecified: Secondary | ICD-10-CM

## 2015-08-28 DIAGNOSIS — Z3A23 23 weeks gestation of pregnancy: Secondary | ICD-10-CM

## 2015-08-28 DIAGNOSIS — R102 Pelvic and perineal pain: Secondary | ICD-10-CM | POA: Diagnosis present

## 2015-08-28 DIAGNOSIS — Z8742 Personal history of other diseases of the female genital tract: Secondary | ICD-10-CM | POA: Diagnosis present

## 2015-08-28 DIAGNOSIS — Z3482 Encounter for supervision of other normal pregnancy, second trimester: Secondary | ICD-10-CM

## 2015-08-28 LAB — URINALYSIS, ROUTINE W REFLEX MICROSCOPIC
Bilirubin Urine: NEGATIVE
Glucose, UA: NEGATIVE mg/dL
Ketones, ur: NEGATIVE mg/dL
Nitrite: NEGATIVE
Protein, ur: NEGATIVE mg/dL
Specific Gravity, Urine: 1.02 (ref 1.005–1.030)
Urobilinogen, UA: 0.2 mg/dL (ref 0.0–1.0)
pH: 7.5 (ref 5.0–8.0)

## 2015-08-28 LAB — CBC
HCT: 35.8 % — ABNORMAL LOW (ref 36.0–46.0)
Hemoglobin: 11.8 g/dL — ABNORMAL LOW (ref 12.0–15.0)
MCH: 27.2 pg (ref 26.0–34.0)
MCHC: 33 g/dL (ref 30.0–36.0)
MCV: 82.5 fL (ref 78.0–100.0)
Platelets: 292 10*3/uL (ref 150–400)
RBC: 4.34 MIL/uL (ref 3.87–5.11)
RDW: 14.3 % (ref 11.5–15.5)
WBC: 15.8 10*3/uL — ABNORMAL HIGH (ref 4.0–10.5)

## 2015-08-28 LAB — TYPE AND SCREEN
ABO/RH(D): O POS
Antibody Screen: NEGATIVE

## 2015-08-28 LAB — URINE MICROSCOPIC-ADD ON

## 2015-08-28 MED ORDER — OXYTOCIN BOLUS FROM INFUSION: 500.0000 mL | INTRAVENOUS | Status: DC

## 2015-08-28 MED ORDER — ONDANSETRON HCL 4 MG/2ML IJ SOLN: 4.0000 mg | Freq: Four times a day (QID) | INTRAMUSCULAR | Status: DC | PRN

## 2015-08-28 MED ORDER — LACTATED RINGERS IV SOLN
INTRAVENOUS | Status: DC
  Administered 2015-08-28: 20:00:00 via INTRAVENOUS

## 2015-08-28 MED ORDER — FAMOTIDINE 20 MG PO TABS
20.0000 mg | ORAL_TABLET | Freq: Two times a day (BID) | ORAL | Status: DC
  Administered 2015-08-28 – 2015-08-31 (×6): 20 mg via ORAL
  Filled 2015-08-28 (×6): qty 1

## 2015-08-28 MED ORDER — OXYTOCIN 40 UNITS IN LACTATED RINGERS INFUSION - SIMPLE MED
62.5000 mL/h | INTRAVENOUS | Status: DC
  Administered 2015-08-29: 62.5 mL/h via INTRAVENOUS
  Filled 2015-08-28: qty 1000

## 2015-08-28 MED ORDER — CITRIC ACID-SODIUM CITRATE 334-500 MG/5ML PO SOLN
30.0000 mL | ORAL | Status: DC | PRN
Start: 2015-08-28 — End: 2015-08-29

## 2015-08-28 MED ORDER — OXYTOCIN 40 UNITS IN LACTATED RINGERS INFUSION - SIMPLE MED: 62.5000 mL/h | INTRAVENOUS | Status: DC

## 2015-08-28 MED ORDER — PENICILLIN G POTASSIUM 5000000 UNITS IJ SOLR
5.0000 10*6.[IU] | Freq: Once | INTRAVENOUS | Status: AC
  Administered 2015-08-28: 5 10*6.[IU] via INTRAVENOUS
  Filled 2015-08-28: qty 5

## 2015-08-28 MED ORDER — MAGNESIUM SULFATE 4 GM/100ML IV SOLN: 4.0000 g | Freq: Once | INTRAVENOUS | Status: DC

## 2015-08-28 MED ORDER — ACETAMINOPHEN 325 MG PO TABS: 650.0000 mg | ORAL_TABLET | ORAL | Status: DC | PRN

## 2015-08-28 MED ORDER — PRENATAL MULTIVITAMIN CH
1.0000 | ORAL_TABLET | Freq: Every morning | ORAL | Status: DC
  Administered 2015-08-29 – 2015-08-31 (×2): 1 via ORAL
  Filled 2015-08-28 (×3): qty 1

## 2015-08-28 MED ORDER — PENICILLIN G POTASSIUM 5000000 UNITS IJ SOLR
2.5000 10*6.[IU] | INTRAVENOUS | Status: DC
  Administered 2015-08-28: 2.5 10*6.[IU] via INTRAVENOUS
  Filled 2015-08-28 (×5): qty 2.5

## 2015-08-28 MED ORDER — OXYCODONE-ACETAMINOPHEN 5-325 MG PO TABS: 1.0000 | ORAL_TABLET | ORAL | Status: DC | PRN

## 2015-08-28 MED ORDER — OXYCODONE-ACETAMINOPHEN 5-325 MG PO TABS: 2.0000 | ORAL_TABLET | ORAL | Status: DC | PRN

## 2015-08-28 MED ORDER — MAGNESIUM SULFATE 50 % IJ SOLN: 2.0000 g/h | Freq: Once | INTRAVENOUS | Status: DC

## 2015-08-28 MED ORDER — MAGNESIUM SULFATE BOLUS VIA INFUSION
4.0000 g | Freq: Once | INTRAVENOUS | Status: AC
  Administered 2015-08-28: 4 g via INTRAVENOUS
  Filled 2015-08-28: qty 500

## 2015-08-28 MED ORDER — LIDOCAINE HCL (PF) 1 % IJ SOLN
30.0000 mL | INTRAMUSCULAR | Status: DC | PRN
  Filled 2015-08-28: qty 30

## 2015-08-28 MED ORDER — MECLIZINE HCL 25 MG PO CHEW: 1.0000 | CHEWABLE_TABLET | Freq: Two times a day (BID) | ORAL | Status: DC | PRN

## 2015-08-28 MED ORDER — INDOMETHACIN 25 MG PO CAPS
25.0000 mg | ORAL_CAPSULE | ORAL | Status: DC
  Administered 2015-08-28: 25 mg via ORAL
  Filled 2015-08-28 (×5): qty 1

## 2015-08-28 MED ORDER — CITRIC ACID-SODIUM CITRATE 334-500 MG/5ML PO SOLN: 30.0000 mL | ORAL | Status: DC | PRN

## 2015-08-28 MED ORDER — LIDOCAINE HCL (PF) 1 % IJ SOLN: 30.0000 mL | INTRAMUSCULAR | Status: DC | PRN

## 2015-08-28 MED ORDER — INDOMETHACIN 50 MG PO CAPS
50.0000 mg | ORAL_CAPSULE | Freq: Once | ORAL | Status: AC
  Administered 2015-08-28: 50 mg via ORAL
  Filled 2015-08-28: qty 1

## 2015-08-28 MED ORDER — MAGNESIUM SULFATE 50 % IJ SOLN
2.0000 g/h | INTRAVENOUS | Status: DC
  Administered 2015-08-28: 2 g/h via INTRAVENOUS
  Filled 2015-08-28: qty 80

## 2015-08-28 MED ORDER — LACTATED RINGERS IV SOLN: 500.0000 mL | INTRAVENOUS | Status: DC | PRN

## 2015-08-28 MED ORDER — BETAMETHASONE SOD PHOS & ACET 6 (3-3) MG/ML IJ SUSP
12.0000 mg | Freq: Once | INTRAMUSCULAR | Status: AC
  Administered 2015-08-28: 12 mg via INTRAMUSCULAR
  Filled 2015-08-28: qty 2

## 2015-08-28 NOTE — Progress Notes (Signed)
Dr. Adrian Blackwater present for ultrasound.  Informed that our OB reporting system will be down for a couple of hours.  He is okay with no official report for now.

## 2015-08-28 NOTE — Progress Notes (Signed)
Call to respiratory team to set up room for delivery

## 2015-08-28 NOTE — Consult Note (Signed)
Neonatology Consult Note:  At the request of the patients obstetrician Dr. 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  She is receiving magnesium sulfate, betamethasone and trendelenburg positioning.     We discussed morbidity/mortality at this gestional age, delivery room resuscitation, including intubation and surfactant in DR.  Discussed mechanical ventilation and risk for chronic lung disease, risk for IVH with potential for motor / cognitive deficits, ROP, NEC, sepsis, as well as temperature instability and feeding immaturity.  Discussed NG / OG feeds, benefits of MBM in reducing incidence of NEC.  Discussed likely length of stay.  She expressed that she would like all resuscitative efforts.    Thank you for allowing [place] to participate in her care.  Please call with questions.  Higinio Roger, DO  Neonatologist  The total length of face-to-face or floor / unit time for this encounter was 25 minutes.  Counseling and / or coordination of care was greater than fifty percent of the time.

## 2015-08-28 NOTE — Progress Notes (Signed)
Neonatology Note:  I met with Ms. Brenda Richards again (via a Spanish interpreter) now that her boyfriend has arrived.  I discussed similar content with her boyfriend as discussed with Ms. Brenda Richards previously.  They were tearful and he expressed hesitancy in having a child with long term health complications but both parents wanted resuscitation.  We discussed a "trial of life" in which we would offer resuscitation and assessment / support in the NICU and that should baby develop severe complications such as IVH, hypotension, inability to oxygenate that withdrawal of support would be offered at that time.  I left them to discuss their wishes further together however at this point they would like resuscitation.  We can be reached at any time at [phone].    _____________________ Electronically Signed By: Higinio Roger, DO  Attending Neonatologist

## 2015-08-28 NOTE — Progress Notes (Addendum)
Patient ID: Brenda Richards, female   DOB: 1981/11/07, 34 y.o.   MRN: [id number]  Patient continued to have contractions.  Indomethacin started at 1800.  Penicillin also started.  Patient also having some mild bleeding with blood clots.  Patient feeling contractions moderately, but have decreased in frequency since starting the indomethacin.  I have discussed with the patient treatment plans, which includes emergent cesarean section for fetal bradycardia.  I discussed with her the possibility of needing to do a classical cesarean section in this case.  Patient acknowledges understanding.  FHT remains reassuring for gestational age with moderate variability, 10x10 accelerations, no decels.  Brenda Heritage, DO 08/28/2015 8:47 PM

## 2015-08-28 NOTE — Progress Notes (Signed)
Chaplin at bedside per patient request.

## 2015-08-28 NOTE — H&P (Signed)
Brenda Richards is a 34 y.o. female presenting for vaginal pressure since Saturday but worse since 0200 this morning. History OB History    Gravida Para Term Preterm AB TAB SAB Ectopic Multiple Living   0  0 0 0 0 2     Past Medical History  Diagnosis Date  . No pertinent past medical history   . Positive PPD, treated    Past Surgical History  Procedure Laterality Date  . No past surgeries    . Cesarean section  07/17/2012    Procedure: CESAREAN SECTION;  Surgeon: Catalina Antigua, MD;  Location: [facility];  Service: Gynecology;  Laterality: N/A;   Family History: family history includes Diabetes in her mother. There is no history of Anesthesia problems. Social History:  reports that she has never smoked. She has never used smokeless tobacco. She reports that she drinks alcohol. She reports that she does not use illicit drugs.   Prenatal Transfer Tool  Maternal Diabetes: No Genetic Screening: Normal Maternal Ultrasounds/Referrals: Normal Fetal Ultrasounds or other Referrals:  None Maternal Substance Abuse:  No Significant Maternal Medications:  None Significant Maternal Lab Results:  None Other Comments:  None  Review of Systems  Constitutional: Negative.   HENT: Negative.   Eyes: Negative.   Respiratory: Negative.   Cardiovascular: Negative.   Gastrointestinal: Negative.   Genitourinary:       Vaginal pressure  Musculoskeletal: Negative.   Skin: Negative.   Neurological: Negative.   Endo/Heme/Allergies: Negative.   Psychiatric/Behavioral: Negative.       Blood pressure 145/81, pulse 115, temperature 98.2 F (36.8 C), resp. rate 18, height  (1.549 m), weight 186 lb 9.6 oz (84.641 kg), last menstrual period 03/19/2015, unknown if currently breastfeeding. Maternal Exam:  Uterine Assessment: Occasional isolated uc  Abdomen: Patient reports no abdominal tenderness. Surgical scars: low transverse.   Fetal presentation: vertex  Introitus: not evaluated.    Pelvis: adequate for delivery.   Cervix: not evaluated.   Fetal Exam Fetal Monitor Review: Mode: ultrasound.   Variability: moderate (6-25 bpm).    Fetal State Assessment: Category I - tracings are normal.     Physical Exam  Constitutional: She is oriented to person, place, and time. She appears well-developed and well-nourished.  HENT:  Head: Normocephalic.  Eyes: Pupils are equal, round, and reactive to light.  Neck: Normal range of motion.  Cardiovascular: Normal rate, regular rhythm, normal heart sounds and intact distal pulses.   Genitourinary: Vagina normal.  Musculoskeletal: Normal range of motion.  Neurological: She is alert and oriented to person, place, and time. She has normal reflexes.  Skin: Skin is warm and dry.  Psychiatric: She has a normal mood and affect. Her behavior is normal. Judgment and thought content normal.    Prenatal labs: ABO, Rh: O/Positive/-- (07/22 0000) Antibody: Negative (07/22 0000) Rubella: Immune (07/22 0000) RPR:    HBsAg: Negative (07/22 0000)  HIV: Non-reactive (07/22 0000)  GBS:     Assessment/Plan: Incompetent cervix at 23.3 wks Membranes at vaginal opening on exam  BMX MGSO4 Trendelenburg Admit Dr. Adrian Blackwater consulted   LAWSON, MARIE DARLENE 08/28/2015, 1:18 PM

## 2015-08-28 NOTE — MAU Note (Signed)
D.Lawson,CNm at bedside for exam. Hourglassing membranes noted. Pt placed in trendelenburg. IV started and bedside U/S ordered.

## 2015-08-28 NOTE — Progress Notes (Signed)
Ms Veva Holes requested a chaplain to come and pray for her unborn baby, her husband and herself. Prayers prayed with the assistance of the interpreter. Chaplain also spent time at the bedside assisting with relaxation exercises to lower anxiety over the prematurity of the delivery and the possibility of complications. Page the chaplain if needed further. Chaplains available at all times.   Benjie Karvonen. Ioanna Colquhoun, DMin Chaplain

## 2015-08-28 NOTE — MAU Note (Signed)
Pt presents to MAU with complaints of vaginal pressure with pink discharge since this morning around 3 am.

## 2015-08-28 NOTE — Telephone Encounter (Signed)
Patient called and stated that she is experiencing spotting with a thin discharge. She is also having a lot of pressure. Pt stated that she is saturating pads with a thin blood tinged discharge. Pt reports that baby is moving well.  I spoke with [place] about patient and she advised that she go to MAU for evaluation.

## 2015-08-29 ENCOUNTER — Encounter (HOSPITAL_COMMUNITY): Payer: Self-pay | Admitting: *Deleted

## 2015-08-29 LAB — HIV ANTIBODY (ROUTINE TESTING W REFLEX): HIV Screen 4th Generation wRfx: NONREACTIVE

## 2015-08-29 LAB — RPR: RPR Ser Ql: NONREACTIVE

## 2015-08-29 MED ORDER — BENZOCAINE-MENTHOL 20-0.5 % EX AERO: 1.0000 "application " | INHALATION_SPRAY | CUTANEOUS | Status: DC | PRN

## 2015-08-29 MED ORDER — ZOLPIDEM TARTRATE 5 MG PO TABS: 5.0000 mg | ORAL_TABLET | Freq: Every evening | ORAL | Status: DC | PRN

## 2015-08-29 MED ORDER — TETANUS-DIPHTH-ACELL PERTUSSIS 5-2.5-18.5 LF-MCG/0.5 IM SUSP
0.5000 mL | Freq: Once | INTRAMUSCULAR | Status: AC
  Administered 2015-08-30: 0.5 mL via INTRAMUSCULAR
  Filled 2015-08-29: qty 0.5

## 2015-08-29 MED ORDER — ONDANSETRON HCL 4 MG/2ML IJ SOLN: 4.0000 mg | INTRAMUSCULAR | Status: DC | PRN

## 2015-08-29 MED ORDER — DIPHENHYDRAMINE HCL 25 MG PO CAPS: 25.0000 mg | ORAL_CAPSULE | Freq: Four times a day (QID) | ORAL | Status: DC | PRN

## 2015-08-29 MED ORDER — OXYCODONE-ACETAMINOPHEN 5-325 MG PO TABS: 1.0000 | ORAL_TABLET | ORAL | Status: DC | PRN

## 2015-08-29 MED ORDER — MISOPROSTOL 200 MCG PO TABS
ORAL_TABLET | ORAL | Status: AC
  Administered 2015-08-29: 600 ug via ORAL
  Filled 2015-08-29: qty 3

## 2015-08-29 MED ORDER — ONDANSETRON HCL 4 MG PO TABS
4.0000 mg | ORAL_TABLET | ORAL | Status: DC | PRN
Start: 2015-08-29 — End: 2015-08-31

## 2015-08-29 MED ORDER — OXYCODONE-ACETAMINOPHEN 5-325 MG PO TABS: 2.0000 | ORAL_TABLET | ORAL | Status: DC | PRN

## 2015-08-29 MED ORDER — SENNOSIDES-DOCUSATE SODIUM 8.6-50 MG PO TABS
2.0000 | ORAL_TABLET | ORAL | Status: DC
  Administered 2015-08-29 – 2015-08-30 (×2): 2 via ORAL
  Filled 2015-08-29 (×2): qty 2

## 2015-08-29 MED ORDER — LANOLIN HYDROUS EX OINT: TOPICAL_OINTMENT | CUTANEOUS | Status: DC | PRN

## 2015-08-29 MED ORDER — PRENATAL MULTIVITAMIN CH
1.0000 | ORAL_TABLET | Freq: Every day | ORAL | Status: DC
  Administered 2015-08-30: 1 via ORAL
  Filled 2015-08-29: qty 1

## 2015-08-29 MED ORDER — SIMETHICONE 80 MG PO CHEW: 80.0000 mg | CHEWABLE_TABLET | ORAL | Status: DC | PRN

## 2015-08-29 MED ORDER — IBUPROFEN 600 MG PO TABS
600.0000 mg | ORAL_TABLET | Freq: Four times a day (QID) | ORAL | Status: DC
  Administered 2015-08-29 – 2015-08-31 (×10): 600 mg via ORAL
  Filled 2015-08-29 (×10): qty 1

## 2015-08-29 MED ORDER — WITCH HAZEL-GLYCERIN EX PADS: 1.0000 "application " | MEDICATED_PAD | CUTANEOUS | Status: DC | PRN

## 2015-08-29 MED ORDER — DIBUCAINE 1 % RE OINT: 1.0000 "application " | TOPICAL_OINTMENT | RECTAL | Status: DC | PRN

## 2015-08-29 MED ORDER — ACETAMINOPHEN 325 MG PO TABS: 650.0000 mg | ORAL_TABLET | ORAL | Status: DC | PRN

## 2015-08-29 NOTE — Lactation Note (Addendum)
This note was copied from the chart of Brenda Navi Cheri Guppy. Lactation Consultation Note  Patient Name: Brenda Richards Date: 08/29/2015 Reason for consult: Initial assessment;NICU baby  NICU baby 57 hours old. Offered to obtain Spanish interpreter, but mom declined. Mom states that she understands English just fine. Mom states that she has a DEBP at home and does not want WIC aware that she is pumping--so no BF referral should be sent. Assisted mom to pump, and enc her to use DEBP 8 times/24 hours for 15 minutes. Demonstrated hand expression with no colostrum present at this time. Discussed with mom the normal progression of milk coming to volume. Enc mom to take/send EBM to NICU for baby. Enc mom to offer STS/kangaroo care and nuzzling/latching as she and baby able. Mom given NICU booklet and LC brochure with review, aware of OP/BFSG, community resources, and [facility] phone line assistance after D/C.  Maternal Data Has patient been taught Hand Expression?: Yes Does the patient have breastfeeding experience prior to this delivery?: Yes  Feeding    LATCH Score/Interventions                      Lactation Tools Discussed/Used WIC Program: Yes Pump Review: Setup, frequency, and cleaning;Milk Storage Initiated by:: Bedside RN Date initiated:: 08/29/15   Consult Status Consult Status: Follow-up Date: 08/30/15 Follow-up type: In-patient    Geralynn Ochs 08/29/2015, 12:42 PM

## 2015-08-29 NOTE — Progress Notes (Signed)
Met with pt and her family to introduce myself and ongoing spiritual care services while she and her infant are in the hospital.  Pt requested chaplain's visit last night before delivery and had visit and prayer.  She reports that today she is doing much better knowing that her baby is doing great for his condition.  Offered listening ear and supportive presence as she discussed disappointment with pending discharge without her baby.  Shared with her the ways we can support her throughout her son's continued stay in NICU.  She is appreciative of support.  Will continue to follow, but please page as needs arise.  Eulas Post, chaplain [phone]     08/29/15 1130  Clinical Encounter Type  Visited With Patient and family together  Visit Type Initial;Spiritual support  Referral From Chaplain  Consult/Referral To Chaplain  Spiritual Encounters  Spiritual Needs Emotional  Stress Factors  Patient Stress Factors Loss of control;Major life changes

## 2015-08-30 LAB — CBC
HCT: 33.8 % — ABNORMAL LOW (ref 36.0–46.0)
Hemoglobin: 10.9 g/dL — ABNORMAL LOW (ref 12.0–15.0)
MCH: 26.8 pg (ref 26.0–34.0)
MCHC: 32.2 g/dL (ref 30.0–36.0)
MCV: 83.3 fL (ref 78.0–100.0)
Platelets: 289 10*3/uL (ref 150–400)
RBC: 4.06 MIL/uL (ref 3.87–5.11)
RDW: 14.3 % (ref 11.5–15.5)
WBC: 10.8 10*3/uL — ABNORMAL HIGH (ref 4.0–10.5)

## 2015-08-30 NOTE — Lactation Note (Signed)
This note was copied from the chart of Brenda Richards. Lactation Consultation Note  Patient Name: Brenda Richards Date: 08/30/2015 Reason for consult: Follow-up assessment;NICU baby  NICU baby 6430 hours old. Mom reports that she still has not seen any colostrum. Enc mom to keep pumping at least 8 times/24 hours for 15 minutes followed by hand expression. Mom given handouts for Fenugreek, Moringa, and Lactation Cookies recipe. Mom is already eating oatmeal for breakfast--discussed steel cut is best. Enc mom to focus primarily on pumping and especially after visiting baby. Maternal Data    Feeding    LATCH Score/Interventions                      Lactation Tools Discussed/Used     Consult Status Consult Status: Follow-up Date: 08/31/15 Follow-up type: In-patient    Geralynn OchsWILLIARD, Caydin Yeatts 08/30/2015, 9:44 AM

## 2015-08-30 NOTE — Progress Notes (Signed)
Post Partum Day 1 Subjective:  Brenda Richards is a 34 y.o. [id number] 7462w4d s/p NSVD.  No acute events overnight.  Pt denies problems with ambulating, voiding or po intake.  She denies nausea or vomiting.  Pain is well controlled.  She has had flatus.  Lochia Minimal.  Plan for birth control is patch vs OCP.  Method of Feeding: breast, pumping  Objective: Blood pressure 104/53, pulse 97, temperature 98.2 F (36.8 C), temperature source Oral, resp. rate 18, height 5\' 1"  (1.549 m), weight 186 lb 9.6 oz (84.641 kg), last menstrual period 03/19/2015, SpO2 98 %, unknown if currently breastfeeding.  Physical Exam:  General: alert, cooperative and no distress Lochia:normal flow Chest: normal WOB Heart: Regular rate Abdomen: +BS, soft, mild TTP (appropriate) Uterine Fundus: firm DVT Evaluation: No evidence of DVT seen on physical exam. Extremities: No edema   Recent Labs  08/28/15 1315 08/30/15 0523  HGB 11.8* 10.9*  HCT 35.8* 33.8*    Assessment/Plan:  ASSESSMENT: Brenda Richards is a 34 y.o. [id number] 8262w4d s/p NSVD with cervical incompotence  Plan for discharge tomorrow, Lactation consult, Social Work consult and Contraception patch vs OCP Continue routine PP care Breastfeeding support PRN  LOS: 2 days   Federico FlakeKimberly Niles Artem Bunte 08/30/2015, 8:45 AM

## 2015-08-31 DIAGNOSIS — O34219 Maternal care for unspecified type scar from previous cesarean delivery: Secondary | ICD-10-CM

## 2015-08-31 DIAGNOSIS — Z8751 Personal history of pre-term labor: Secondary | ICD-10-CM

## 2015-08-31 MED ORDER — IBUPROFEN 600 MG PO TABS: 600.0000 mg | ORAL_TABLET | Freq: Four times a day (QID) | ORAL | Status: DC | PRN

## 2015-08-31 NOTE — Discharge Summary (Signed)
OB Discharge Summary  Patient Name: Brenda Richards DOB: 10/18/1981 MRN: [id number]  Date of admission: 08/28/2015 Delivering MD: Brenda Richards   Date of discharge: 08/31/2015  Admitting diagnosis: 6 MTHS PRESSURE Intrauterine pregnancy: 8110w4d     Secondary diagnosis: Incompetent cervix     Discharge diagnosis: VBAC, preterm delivery                                                                                                Post partum procedures:None  Augmentation: Cytotec after fetus found to be in vagina by ultrasound (bulging bag at introitus)  Complications: None  Active Problems:   Incompetent cervix   Preterm labor   Preterm delivery   Vaginal birth after cesarean [facility](VBAC)  Hospital course:  Onset of Labor With Vaginal Delivery     34 y.o. yo [id number] at 2710w4d was admitted in Active Labor on 08/28/2015. Patient had an uncomplicated labor course as follows:  Membrane Rupture Time/Date: 2:57 AM ,08/29/2015   Intrapartum Procedures: Episiotomy: None [1]                                         Lacerations:  None [1]   Prior to delivery, patient was given BMX x1 and MgSO4. Indomethacin was given in an attempt to slow down labor. Patient had a delivery of a Viable infant. Infant was severely preterm and was admitted to the NICU.   08/29/2015  Information for the patient's newborn:  Brenda Richards, Boy Brenda Richards [[id number]][[id number]]  Delivery Method: VBAC, Spontaneous (Filed from Delivery Summary)     Pateint had an uncomplicated postpartum course.  She is ambulating, tolerating a regular diet, passing flatus, and urinating well. Patient is discharged home in stable condition on 08/31/2015  3:20 PM.    Physical exam  Filed Vitals:   08/30/15 1200 08/30/15 1840 08/30/15 2121 08/31/15 0512  BP: 108/66 113/72 121/70 110/66  Pulse: 66 65 74 68  Temp: 98.5 F (36.9 C) 98.5 F (36.9 C) 99 F (37.2 C) 98.5 F (36.9 C)  TempSrc: Oral Oral Oral Oral  Resp: 18 16 20 18    Height:      Weight:      SpO2: 98% 100% 99% 99%   General: alert and cooperative Lochia: appropriate Uterine Fundus: firm Incision: N/A DVT Evaluation: No evidence of DVT seen on physical exam. Labs: Lab Results  Component Value Date   WBC 10.8* 08/30/2015   HGB 10.9* 08/30/2015   HCT 33.8* 08/30/2015   MCV 83.3 08/30/2015   PLT 289 08/30/2015   CMP Latest Ref Rng 07/17/2012  Creatinine 0.50 - 1.10 mg/dL 0.100.59    Discharge instruction: per After Visit Summary and "Baby and Me Booklet".  Medications: No current facility-administered medications for this encounter.  Current outpatient prescriptions:  .  Prenatal Vit-Fe Fumarate-FA (PRENATAL MULTIVITAMIN) TABS, Take 1 tablet by mouth every morning., Disp: , Rfl:  .  ibuprofen (ADVIL,MOTRIN) 600 MG tablet, Take 1 tablet (600 mg total) by mouth every 6 (  six) hours as needed., Disp: 30 tablet, Rfl: 0  Diet: routine diet  Activity: Advance as tolerated. Pelvic rest for 6 weeks.   Outpatient follow up:6 weeks  Future Appointments Date Time Provider Department Center  10/05/2015 2:20 PM Judeth Horn, NP [facility]   Postpartum contraception: Patch vs OCP  Newborn Data: Live born female  Birth Weight: 1 lb 7.3 oz (661 g) APGAR: 3, 5  Baby Feeding: Breast Disposition:NICU   08/31/2015 Brenda Haring, MD

## 2015-08-31 NOTE — Lactation Note (Signed)
This note was copied from the chart of Brenda Richards. Lactation Consultation Note  Patient Name: Brenda Richards ZOXWR'UToday's Date: 08/31/2015 Reason for consult: Follow-up assessment;NICU baby  NICU baby 2954 hours old. Mom is on her way to take EBM to NICU. Mom [facility] this LC [facility] because mom is wanting a WIC DEBP in order to continue pumping and providing EBM to baby. Referral faxed. Mom states that she thinks she will be D/C'd today. Enc mom to [facility].  Maternal Data    Feeding    LATCH Score/Interventions                      Lactation Tools Discussed/Used     Consult Status Consult Status: PRN    Geralynn OchsWILLIARD, Khairi Garman 08/31/2015, 9:06 AM

## 2015-08-31 NOTE — Progress Notes (Addendum)
Pt  D/c teaching complete  [facility]  For [facility]  Ambulated out

## 2015-08-31 NOTE — Lactation Note (Signed)
This note was copied from the chart of Brenda Richards. Lactation Consultation Note  Patient Name: Brenda Richards: 08/31/2015   Baby 58 hours old. Mom had questions about milk storage. Referred mom to her NICU booklet for EBM storage guidelines. Mom states that she has an appointment with WIC to get a pump today.   Maternal Data    Feeding    LATCH Score/Interventions                      Lactation Tools Discussed/Used     Consult Status      Nancy NordmannWILLIARD, [name] 08/31/2015, 1:20 PM

## 2015-08-31 NOTE — Progress Notes (Signed)
D/c instrutions   Given   Teaching complete

## 2015-08-31 NOTE — Discharge Instructions (Signed)

## 2015-08-31 NOTE — Progress Notes (Signed)
I spent time with pt per referral from [facility]plain [facility] Davee Lomax and U.[facility] BancorpChaplain Charlie Lumpkin.  Ms Veva HolesVazquez reports that she is coping better today.  She was very shocked at first seeing her son in the NICU, but she stated that it is getting easier.  Her family lives in [place].  Her parents have been to visit and her uncle has stayed a few extra days to help care for her 33 year old while she is in the hospital.  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  Her SO is assuming the full responsibility of financially supporting them and this has been stressful for them both.    She is sad to be leaving today without her baby, but she recognizes that he is in the place where he needs to be for his care.  I offered prayer, at her request.  We will continue to follow up with her as we are able.  [address] Wake[address] DriveChaplain Dyanne CarrelKaty Illona Bulman, Bcc Pager, [phone] 11:14 AM    08/31/15 1100  Clinical Encounter Type  Visited With Patient  Visit Type Follow-up;Spiritual support  Referral From Chaplain  Spiritual Encounters  Spiritual Needs Emotional

## 2015-09-01 NOTE — Clinical Social Work Maternal (Signed)
CLINICAL SOCIAL WORK MATERNAL/CHILD NOTE  Patient Details  Name: Brenda Richards MRN: [id number] Date of Birth: [birth year]  Date:  09/01/2015  Clinical Social Worker Initiating Note:  Belina Mandile E. Brigitte Pulse, [place] Date/ Time Initiated:  08/31/15/0930     Child's Name:  Audley Hose   Legal Guardian:   (Parents: Hurman Horn and Sherlon Handing)   Need for Interpreter:  None (Spanish is primary language, however MOB speaks English fluently.)   Date of Referral:        Reason for Referral:   (No referral-NICU admission)   Referral Source:      Address:  [address] [place], [place], [place], [address] (MOB states they have plans to move to a bigger home.)  Phone number:  [phone]   Household Members:  Significant Other, Minor Children (Couple has one other child together: Randy Guzman/age 6 and MOB has a child from a previous relationship: Alex Ramirez/age 68.)   Natural Supports (not living in the home):  Friends, Extended Family, Immediate Family (MOB states her brother is supportive and lives in [place] and that her mother is also supportive and lives in [place].)   Professional Supports:     Employment:  (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  FOB works in Architect in [place])   Type of Work:     Education:      Pensions consultant:  Self-Pay  (MOB has met with [employer] to apply for Medicaid for the baby.  He also qualifies for SSI.)   Other Resources:      Cultural/Religious Considerations Which May Impact Care: None stated.  MOB's facesheet states religion as Nurse, learning disability.  Strengths:  Ability to meet basic needs , Compliance with medical plan , Pediatrician chosen , Understanding of illness (MOB states her other children receive pediatric care at [facility].)   Risk Factors/Current Problems:  None   Cognitive State:  Alert , Linear Thinking , Goal Oriented , Insightful     Mood/Affect:  Tearful , Interested , Calm    CSW Assessment: CSW met with MOB in her third floor room/309 to introduce services, offer support and complete assessment due to baby's admission to NICU at 23.4 weeks.  MOB was extremely pleasant and welcoming of CSW's visit.  She immediately told CSW that she has never had this experience before because she has two older sons who were both born at term.  She seemed to appreciate the space to tell her birth story and CSW provided active listening and supportive brief counseling in order for her to being to process the unexpected experience.  MOB states she was scared when all of the hospital staff came in to her room and she learned that she would be delivering her baby at 23 weeks.  She states she had previously met with a doctor who told her about prognosis at 23 weeks, which "scared [place] (MOB and FOB)."  CSW acknowledged, normalized and validated her feelings regarding her baby's premature delivery.   MOB appears to have a good understanding of her baby's medical condition and that he is still very critical, although stable.  CSW encouraged MOB to takes this no more than one day at a time and celebrate the positive aspects of her situation.  She replied, "he's stable and I got some milk."  CSW celebrated with MOB.  CSW discussed common emotions often experienced after the birth of a baby, especially one born so  prematurely and admitted to the NICU.  CSW also provided education on perinatal mood disorders and asked that MOB talk with CSW and or her doctor if she has concerns at any time or would like to process her emotions.  MOB seemed appreciative of this.  She was tearful at times.   MOB states she has a good support system, but that the family only has one car, which FOB takes to [place], [place] to work.  She states she has friends who will transport her to the hospital to be with baby.  She reports that she does not know how to take the city bus.  CSW  explained that she can have unlimited bus passes if she determines that she needs to learn how to take the bus in order to be with baby in the hospital on her own schedule.  She thanked CSW.  MOB states they have not begun to prepare for baby at home.  CSW explained that she does not need to think about baby supplies at this point, but in the future, if she is finding it difficult to get the basic items together, to let CSW know.  CSW explained support offered by [employer], as MOB said numerous times that she feels like she doesn't know anyone else who has ever been through this before.  CSW reminded MOB that she should not expect to know how to cope with this situation, but that it is a process, and to allow herself to be emotional.   CSW informed MOB of baby's eligibility for SSI and she is interested in applying.  She understands that she must apply at the [employer].  CSW obtained signature on authorization to disclose protected health information and provided MOB with a copy of baby's Admission Summary to take with her when she applies.  MOB was very appreciative for the information and instruction.   MOB states no questions, concerns or needs at this time.  CSW notes no social concerns at this time.  CSW Plan/Description:  Engineer, mining , Psychosocial Support and Ongoing Assessment of Needs, Information/Referral to [place], [place], [place] 09/01/2015, 9:14 AM

## 2015-09-05 ENCOUNTER — Encounter: Payer: Self-pay | Admitting: Certified Nurse Midwife

## 2015-09-07 ENCOUNTER — Ambulatory Visit: Payer: Self-pay

## 2015-09-07 NOTE — Lactation Note (Signed)
This note was copied from the chart of [place]. Lactation Consultation Note     With this mom of a NICU baby, now 110 days old, and 24 6/7 weeks CGA. Mom is concerned about a LMS. She is pumping for only 15 minutes, about 4-5 times a day, and gettng 20-30 ml's. I reviewed with mom the importance of pumping around the clock, at lest 8 times a day, and to pump now until she stops dripping.I assisted mom with pumping, gave her a new kit, and advised her to pump when she visits her baby each day, in addition to home.  I advised her to add HE prior to , during and after pumping, and to pump every 2 hours  As often as possible, until her milk supply increases. Mom knows to call for questions/cocerns/   Patient Name: Brenda Richards [id number] Date: 09/07/2015 Reason for consult: Follow-up assessment   Maternal Data    Feeding    LATCH Score/Interventions                      Lactation Tools Discussed/Used Pump Review: Setup, frequency, and cleaning;Other (comment) (hand expression)   Consult Status Consult Status: PRN Follow-up type: In-patient (NICU)    [name] 09/07/2015, 4:39 PM

## 2015-10-03 ENCOUNTER — Ambulatory Visit (INDEPENDENT_AMBULATORY_CARE_PROVIDER_SITE_OTHER): Payer: Self-pay | Admitting: Advanced Practice Midwife

## 2015-10-03 ENCOUNTER — Encounter: Payer: Self-pay | Admitting: Advanced Practice Midwife

## 2015-10-03 VITALS — BP 122/68 | HR 76 | Temp 98.4°F | Ht 60.0 in | Wt 183.0 lb

## 2015-10-03 DIAGNOSIS — IMO0001 Reserved for inherently not codable concepts without codable children: Secondary | ICD-10-CM

## 2015-10-03 MED ORDER — NORETHINDRONE 0.35 MG PO TABS: 1.0000 | ORAL_TABLET | Freq: Every day | ORAL | Status: DC

## 2015-10-03 NOTE — Progress Notes (Signed)
  Subjective:     Brenda Richards is a 34 y.o. female who presents for a postpartum visit. She is 5 weeks postpartum following a spontaneous vaginal delivery. I have fully reviewed the prenatal and intrapartum course. The delivery was at 23 gestational weeks. Outcome: spontaneous vaginal delivery. Anesthesia: none. Postpartum course has been uneventful. Baby's course has been usual for NICU, but baby is scheduled to have heart surgery soon. Baby is feeding by none (on ventilator). Bleeding staining only. Bowel function is normal. Bladder function is normal. Patient is not sexually active. Contraception method is oral progesterone-only contraceptive. Postpartum depression screening: negative.  She is concerned over not making enough milk.  States is pumping every 3 hours  Suggested Fenugreek and increase water intake.   The following portions of the patient's history were reviewed and updated as appropriate: allergies, current medications, past family history, past medical history, past social history, past surgical history and problem list.  Review of Systems Pertinent items are noted in HPI.   Objective:    BP 122/68 mmHg  Pulse 76  Temp(Src) 98.4 F (36.9 C) (Oral)  Ht 5' (1.524 m)  Wt 183 lb (83.008 kg)  BMI 35.74 kg/m2  Breastfeeding? Yes  General:  alert, cooperative and no distress   Breasts:  inspection negative, no nipple discharge or bleeding, no masses or nodularity palpable  Lungs: clear to auscultation bilaterally  Heart:  RRR  Abdomen: soft, non-tender; bowel sounds normal; no masses,  no organomegaly   Vulva:  not evaluated  Vagina: not evaluated  Cervix:  n/a  Corpus: not examined  Adnexa:  not evaluated  Rectal Exam: Not performed.        Assessment:     Normal postpartum exam. Pap smear not done at today's visit.   Plan:    1. Contraception: oral progesterone-only contraceptive 2. Rx sent for Mini-Pill 3. Follow up in: 1 year or as needed.

## 2015-10-03 NOTE — Patient Instructions (Signed)

## 2015-10-05 ENCOUNTER — Ambulatory Visit: Payer: Self-pay | Admitting: Student

## 2015-12-29 IMAGING — US US MFM OB LIMITED
1 series · 15 of 21 positions shown · non-contrast
Comparison: none

[Series 1: us mfm ob limited · 15 of 21 slices shown]
[im 1/21]
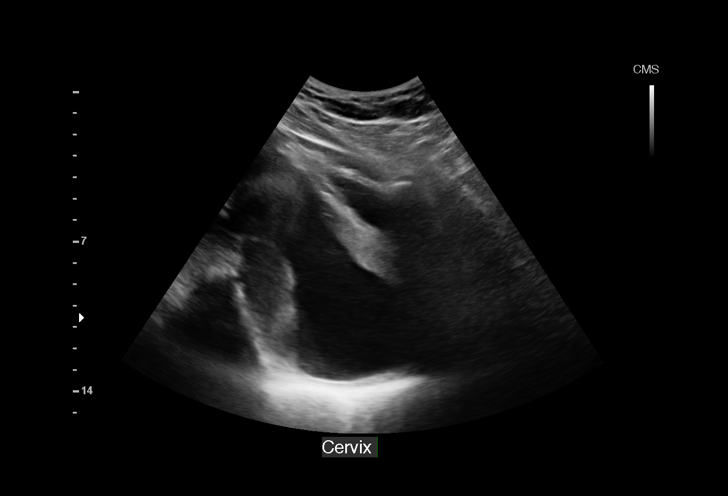
[im 3/21]
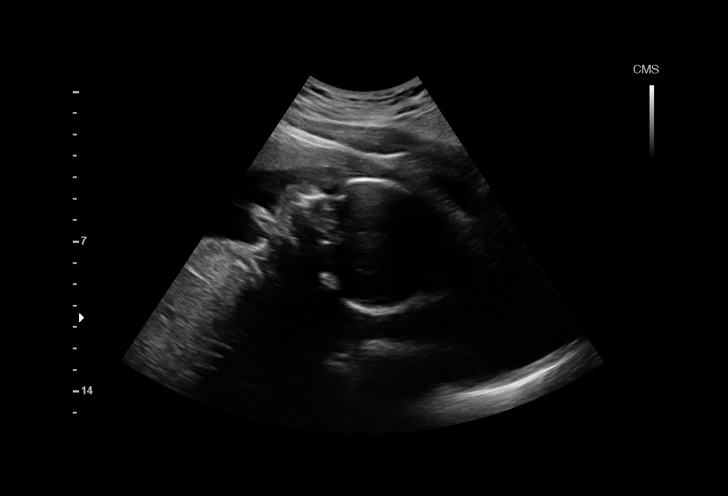
[im 4/21]
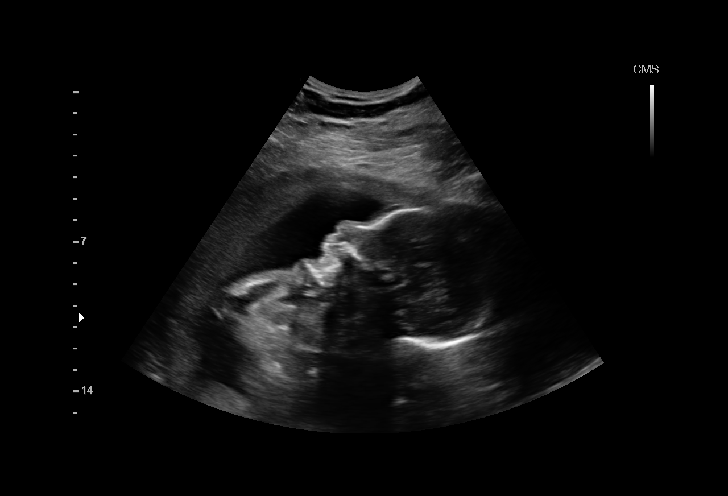
[im 5/21]
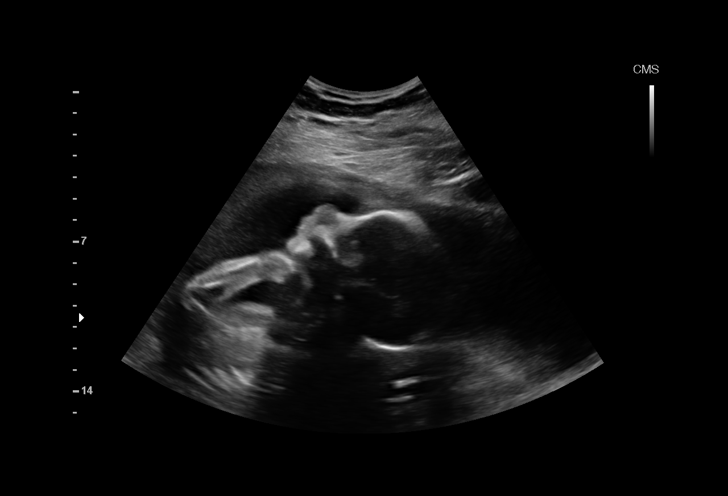
[im 7/21]
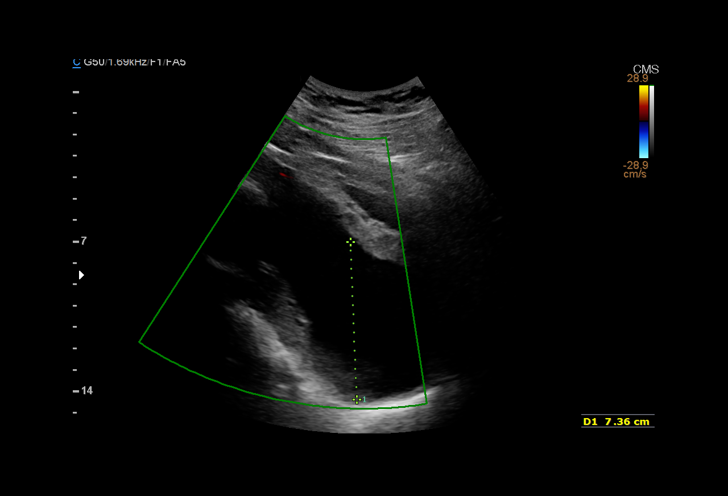
[im 8/21]
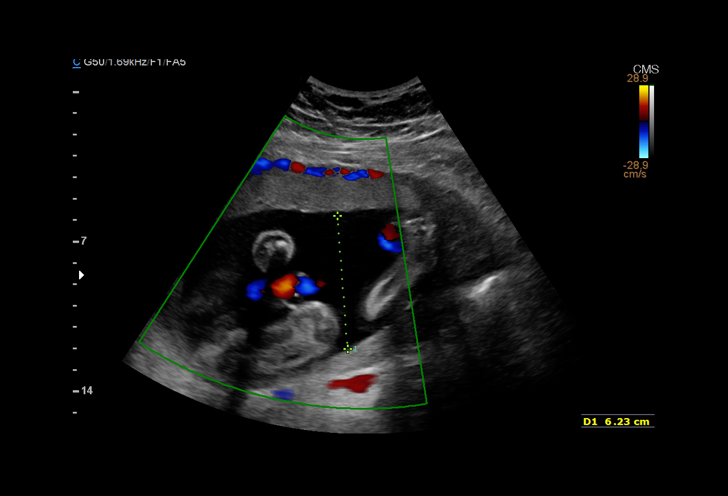
[im 10/21]
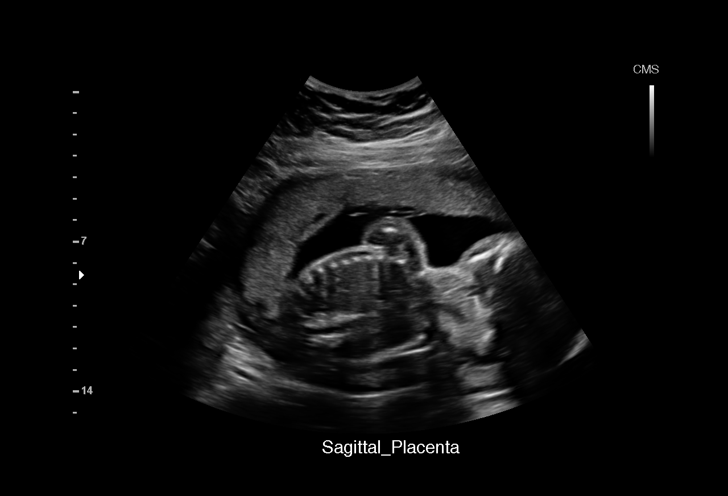
[im 11/21]
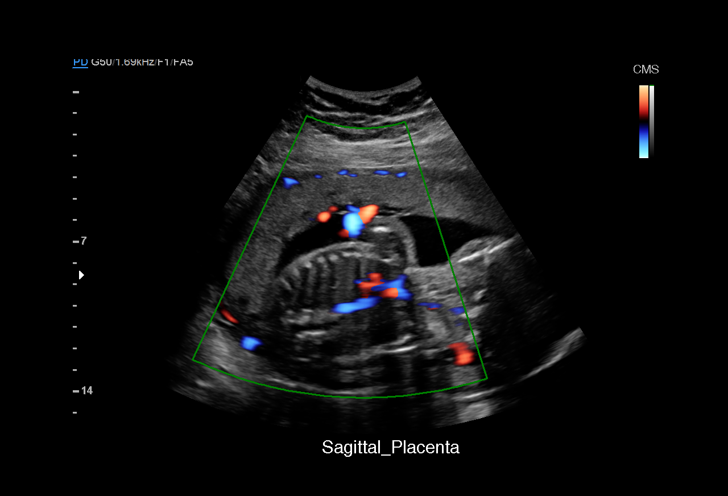
[im 12/21]
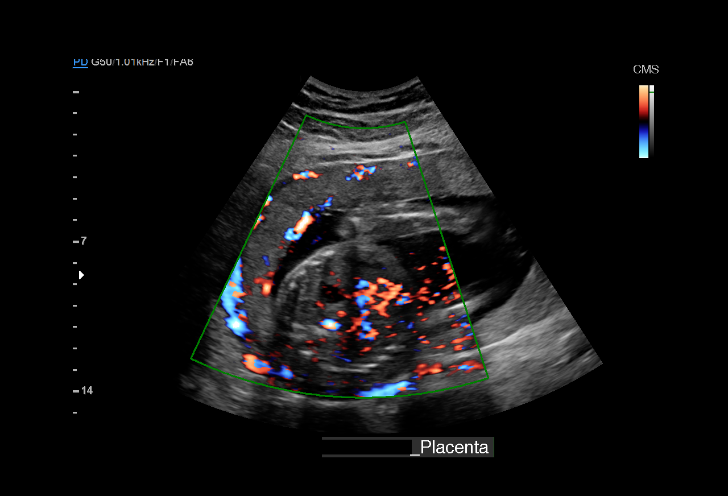
[im 14/21]
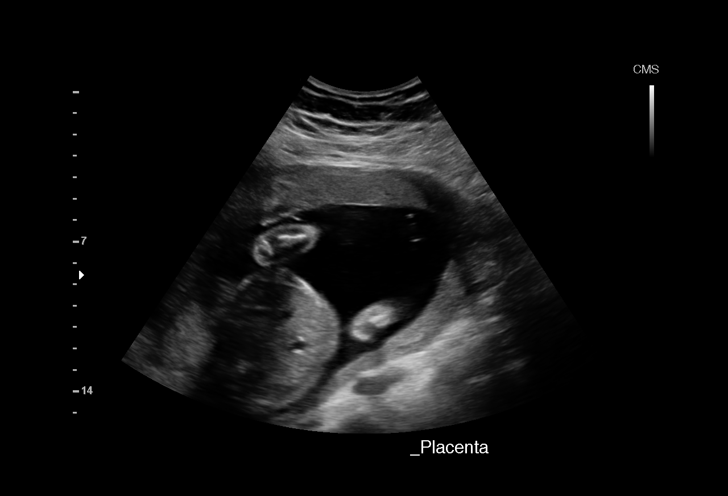
[im 15/21]
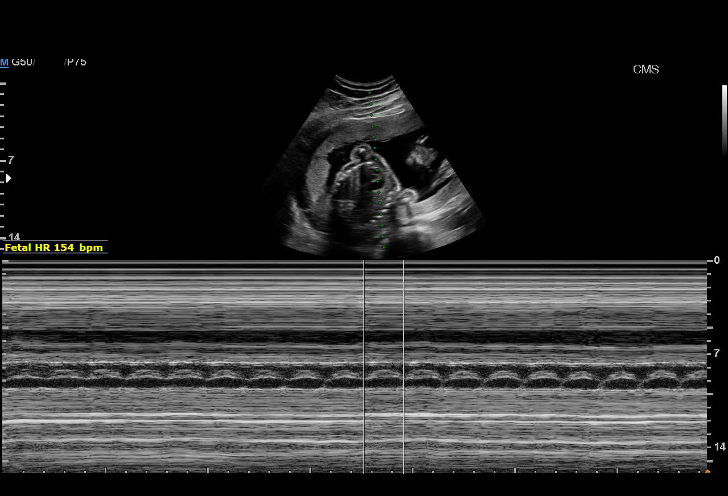
[im 17/21]
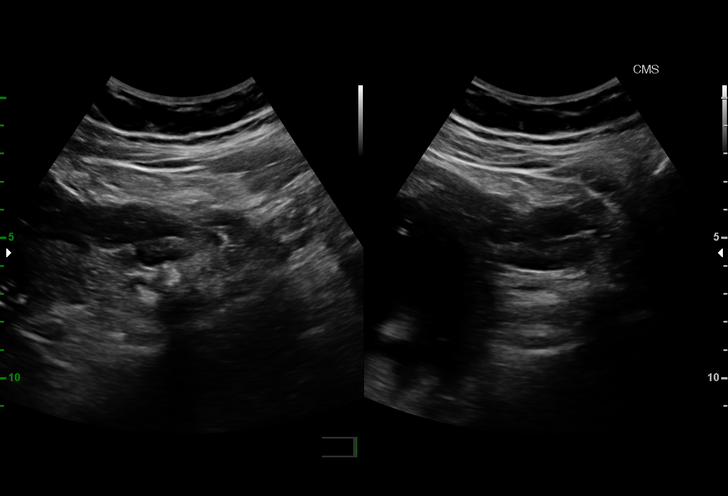
[im 18/21]
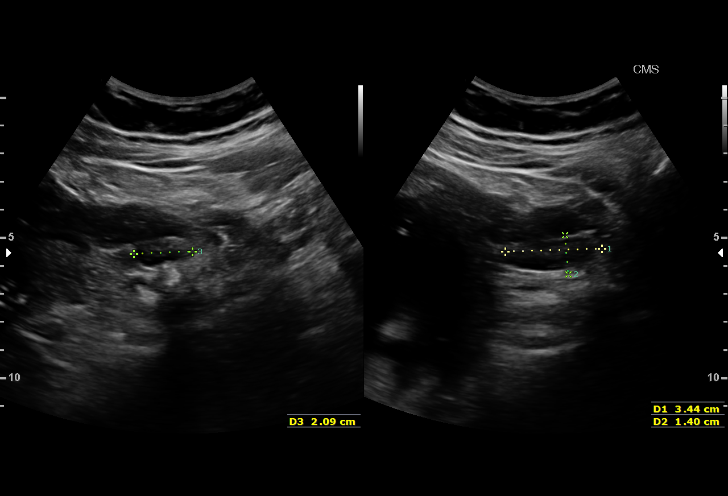
[im 19/21]
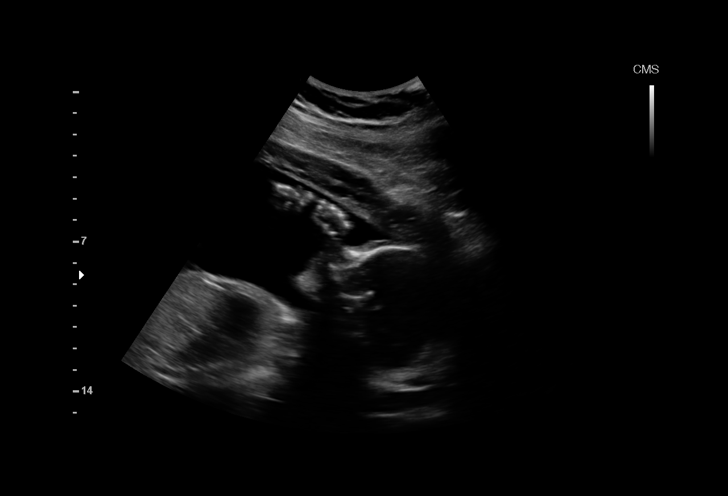
[im 21/21]
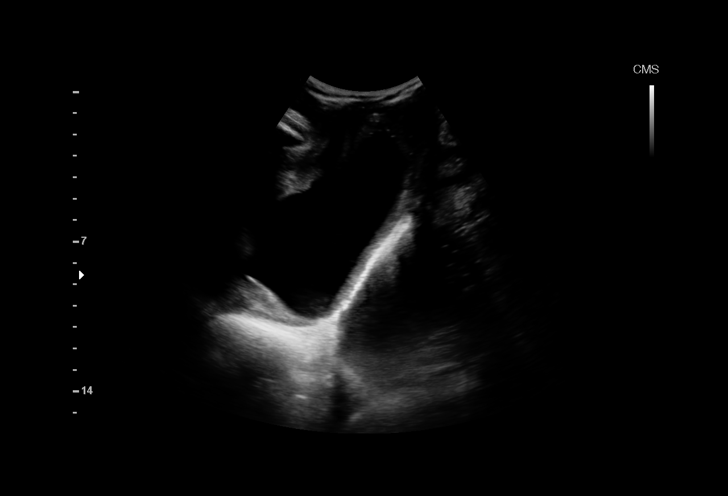

[15 of 21 positions shown; findings below may reference images not displayed]

OBSTETRICS REPORT
(Signed Final 08/28/2015 [DATE])

LIN

Service(s) Provided

US MFM OB LIMITED                                     76815.01
Indications

23 weeks gestation of pregnancy
Cervical incompetence, second trimester
Fetal Evaluation

Num Of Fetuses:    1
Fetal Heart Rate:  154                          bpm
Cardiac Activity:  Observed
Presentation:      Cephalic
Placenta:          Anterior, above cervical os
P. Cord            Visualized, central
Insertion:

Amniotic Fluid
AFI FV:      Subjectively within normal limits
Larg Pckt:   7.36  cm
Gestational Age

LMP:           23w 1d        Date:  03/19/15                 EDD:    12/24/15
Best:          23w 2d     Det. By:  U/S (08/04/15)           EDD:    12/23/15
Cervix Uterus Adnexa

Cervix:       Hourglass membranes into the vagina.

Left Ovary:    Within normal limits.
Right Ovary:   Not visualized.
Impression

SIUP at 23+2 weeks
Cephalic presentation
Normal amniotic fluid volume
Prolapsed membranes through a dilated cervix (
 5-6 cms)
Recommendations

Follow-up ultrasounds as clinically indicated.

## 2019-07-29 ENCOUNTER — Other Ambulatory Visit: Payer: Self-pay

## 2019-07-29 DIAGNOSIS — Z20822 Contact with and (suspected) exposure to covid-19: Secondary | ICD-10-CM

## 2019-07-30 LAB — NOVEL CORONAVIRUS, NAA: SARS-CoV-2, NAA: DETECTED — AB

## 2020-06-16 ENCOUNTER — Other Ambulatory Visit: Payer: Self-pay

## 2020-06-16 ENCOUNTER — Ambulatory Visit (HOSPITAL_COMMUNITY)
Admission: EM | Admit: 2020-06-16 | Discharge: 2020-06-16 | Disposition: A | Discharge status: Home / Self Care | Payer: Commercial Managed Care - PPO

## 2020-06-16 ENCOUNTER — Encounter (HOSPITAL_COMMUNITY): Payer: Self-pay

## 2020-06-16 DIAGNOSIS — J069 Acute upper respiratory infection, unspecified: Secondary | ICD-10-CM

## 2020-06-16 DIAGNOSIS — J029 Acute pharyngitis, unspecified: Secondary | ICD-10-CM

## 2020-06-16 DIAGNOSIS — J3489 Other specified disorders of nose and nasal sinuses: Secondary | ICD-10-CM

## 2020-06-16 DIAGNOSIS — R519 Headache, unspecified: Secondary | ICD-10-CM

## 2020-06-16 MED ORDER — PROMETHAZINE-DM 6.25-15 MG/5ML PO SYRP: 5.0000 mL | ORAL_SOLUTION | Freq: Every evening | ORAL | 0 refills | Status: DC | PRN

## 2020-06-16 MED ORDER — BENZONATATE 100 MG PO CAPS: 100.0000 mg | ORAL_CAPSULE | Freq: Three times a day (TID) | ORAL | 0 refills | Status: DC | PRN

## 2020-06-16 MED ORDER — CETIRIZINE HCL 10 MG PO TABS: 10.0000 mg | ORAL_TABLET | Freq: Every day | ORAL | 0 refills | Status: DC

## 2020-06-16 MED ORDER — PSEUDOEPHEDRINE HCL 30 MG PO TABS: 30.0000 mg | ORAL_TABLET | Freq: Three times a day (TID) | ORAL | 0 refills | Status: DC | PRN

## 2020-06-16 NOTE — ED Triage Notes (Signed)
Patient  Is here today with complaints of cough(clear), headache and sore throat for the past week. Patient states she has tried OTC NyQuil, DayQuil, Tylenol with no relief. Patient states the coughing is keeping her awake at night. Patient states is full vaccinated.

## 2020-06-16 NOTE — ED Provider Notes (Signed)
[facility]   MRN: [id number] DOB: Mar 04, 1981  Subjective:   Brenda Richards is a 39 y.o. female presenting for 1 week history of persistent runny and stuffy nose, postnasal drainage, throat pain, cough that elicits her throat pain, chest pain and makes it difficult for her to get sleep.  She had a Covid vaccination February March of this year.  Denies loss of sense of taste and smell.  Has not had sick contacts.  Denies fever, shortness of breath, wheezing.  Has used multiple over-the-counter medications with minimal relief.  Denies history of asthma, is not a smoker.  No current facility-administered medications for this encounter.  Current Outpatient Medications:  .  benzonatate (TESSALON) 100 MG capsule, Take 1-2 capsules (100-200 mg total) by mouth 3 (three) times daily as needed., Disp: 60 capsule, Rfl: 0 .  cetirizine (ZYRTEC ALLERGY) 10 MG tablet, Take 1 tablet (10 mg total) by mouth daily., Disp: 30 tablet, Rfl: 0 .  ibuprofen (ADVIL,MOTRIN) 600 MG tablet, Take 1 tablet (600 mg total) by mouth every 6 (six) hours as needed., Disp: 30 tablet, Rfl: 0 .  norethindrone (MICRONOR,CAMILA,ERRIN) 0.35 MG tablet, Take 1 tablet (0.35 mg total) by mouth daily., Disp: 1 Package, Rfl: 11 .  Prenatal Vit-Fe Fumarate-FA (PRENATAL MULTIVITAMIN) TABS, Take 1 tablet by mouth every morning., Disp: , Rfl:  .  promethazine-dextromethorphan (PROMETHAZINE-DM) 6.25-15 MG/5ML syrup, Take 5 mLs by mouth at bedtime as needed for cough., Disp: 100 mL, Rfl: 0 .  pseudoephedrine (SUDAFED) 30 MG tablet, Take 1 tablet (30 mg total) by mouth every 8 (eight) hours as needed for congestion., Disp: 30 tablet, Rfl: 0 .  Vitamin D, Ergocalciferol, (DRISDOL) 1.25 MG (50000 UNIT) CAPS capsule, Take 50,000 Units by mouth once a week., Disp: , Rfl:    No Known Allergies  Past Medical History:  Diagnosis Date  . No pertinent past medical history   . Positive PPD, treated      Past Surgical History:    Procedure Laterality Date  . CESAREAN SECTION  07/17/2012   Procedure: CESAREAN SECTION;  Surgeon: Catalina Antigua, MD;  Location: [facility];  Service: Gynecology;  Laterality: N/A;  . NO PAST SURGERIES      Family History  Problem Relation Age of Onset  . Diabetes Mother   . Anesthesia problems Neg Hx     Social History   Tobacco Use  . Smoking status: Never Smoker  . Smokeless tobacco: Never Used  Vaping Use  . Vaping Use: Never used  Substance Use Topics  . Alcohol use: Yes    Comment: in early pregnancy  . Drug use: No    ROS   Objective:   Vitals: BP (!) 133/84 (BP Location: Left Arm)   Pulse 83   Temp 98.5 F (36.9 C) (Tympanic)   Resp 16   LMP 05/31/2020 (Approximate)   SpO2 99%   Breastfeeding No   Physical Exam Constitutional:      General: She is not in acute distress.    Appearance: Normal appearance. She is well-developed. She is not ill-appearing, toxic-appearing or diaphoretic.  HENT:     Head: Normocephalic and atraumatic.     Right Ear: Tympanic membrane and ear canal normal. No drainage or tenderness. No middle ear effusion. Tympanic membrane is not erythematous.     Left Ear: Tympanic membrane and ear canal normal. No drainage or tenderness.  No middle ear effusion. Tympanic membrane is not erythematous.     Nose: Congestion and rhinorrhea  present.     Mouth/Throat:     Mouth: Mucous membranes are moist. No oral lesions.     Pharynx: No pharyngeal swelling, oropharyngeal exudate, posterior oropharyngeal erythema or uvula swelling.     Tonsils: No tonsillar exudate or tonsillar abscesses.  Eyes:     General: No scleral icterus.       Right eye: No discharge.        Left eye: No discharge.     Extraocular Movements: Extraocular movements intact.     Right eye: Normal extraocular motion.     Left eye: Normal extraocular motion.     Conjunctiva/sclera: Conjunctivae normal.     Pupils: Pupils are equal, round, and reactive to light.   Cardiovascular:     Rate and Rhythm: Normal rate and regular rhythm.     Pulses: Normal pulses.     Heart sounds: Normal heart sounds. No murmur heard.  No friction rub. No gallop.   Pulmonary:     Effort: Pulmonary effort is normal. No respiratory distress.     Breath sounds: Normal breath sounds. No stridor. No wheezing, rhonchi or rales.  Musculoskeletal:     Cervical back: Normal range of motion and neck supple.  Lymphadenopathy:     Cervical: No cervical adenopathy.  Skin:    General: Skin is warm and dry.     Findings: No rash.  Neurological:     General: No focal deficit present.     Mental Status: She is alert and oriented to person, place, and time.  Psychiatric:        Mood and Affect: Mood normal.        Behavior: Behavior normal.        Thought Content: Thought content normal.        Judgment: Judgment normal.      Assessment and Plan :   PDMP not reviewed this encounter.  1. Viral URI with cough   2. Sore throat   3. Sinus headache   4. Stuffy and runny nose     Suspect viral URI, viral syndrome; physical exam findings reassuring and vital signs stable for discharge. Advised supportive care, offered symptomatic relief.  Patient prefers not to do COVID-19 testing now as she does not have shortness of breath, had her Covid vaccination already.  Discussed that if she has no improvement over the next few days, she will return and we will do a COVID-19 test, reexamine and consider chest x-ray.  Counseled patient on potential for adverse effects with medications prescribed/recommended today, ER and return-to-clinic precautions discussed, patient verbalized understanding.     Wallis Bamberg, [place] 06/16/20 1944

## 2020-06-16 NOTE — Discharge Instructions (Addendum)
Para el dolor de garganta o tos puede usar un t de miel. Use 3 cucharaditas de miel con jugo exprimido de medio limn. Coloque trozos de jengibre afeitado en 1/2-1 taza de agua y caliente sobre la estufa. Luego mezcle los ingredientes y repita cada 4 horas. Para fiebre, dolores de cuerpo tome ibuprofeno 400mg-600mg con comida cada 6 horas alternando con o junto con Tylenol 500mg-650mg cada 6 horas. Hidrata muy bien con al menos 2 litros (64 onzas) de agua al dia. Coma comidas ligeras como sopas para mantener los electrolitos y nutricion. Tambien puede tomar suero. Comience un antihistamnico como Zyrtec (cetirizina) 10mg al dia. Puede usar pseudoefedrina (Sudafed) de venta libre para el goteo posnasal, congestin a una dosis de 30 mg cada 8 horas o cada 12 horas. Use el jarabe por la noche para su tos y las capsulas durante el dia.  

## 2020-10-30 ENCOUNTER — Other Ambulatory Visit: Payer: Self-pay | Admitting: Urgent Care

## 2020-10-30 DIAGNOSIS — R945 Abnormal results of liver function studies: Secondary | ICD-10-CM

## 2020-11-22 ENCOUNTER — Ambulatory Visit
Admission: RE | Admit: 2020-11-22 | Discharge: 2020-11-22 | Disposition: A | Discharge status: Home / Self Care | Payer: Commercial Managed Care - PPO | Source: Ambulatory Visit | Attending: Urgent Care | Admitting: Urgent Care

## 2020-11-22 DIAGNOSIS — R945 Abnormal results of liver function studies: Secondary | ICD-10-CM

## 2021-03-25 IMAGING — US US ABDOMEN LIMITED
1 series · 14 of 25 positions shown · non-contrast
Comparison: None.

CLINICAL DATA: Abnormal results of liver function studies

EXAM:
ULTRASOUND ABDOMEN LIMITED RIGHT UPPER QUADRANT

[Series 1: us abdomen limited · 0.26mm/px · 14 of 40 slices shown]
[im 1/40]
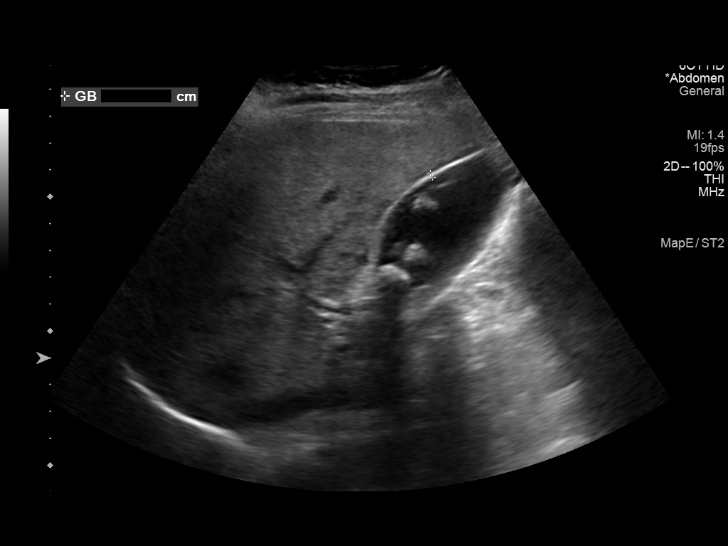
[im 4/40]
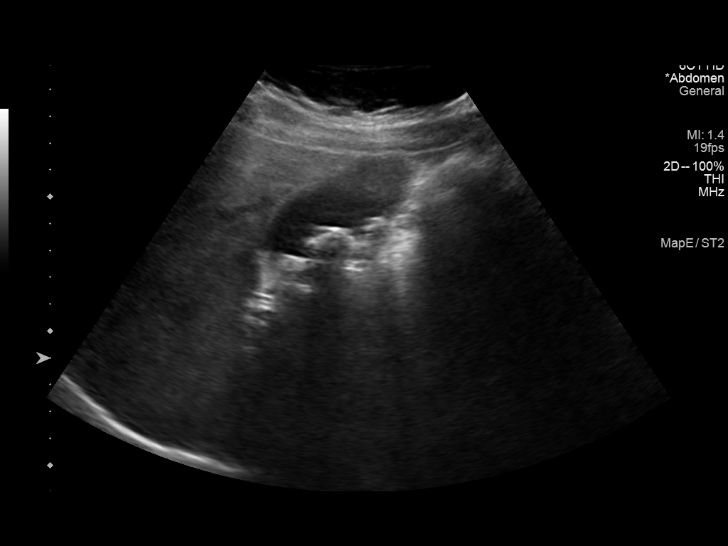
[im 7/40]
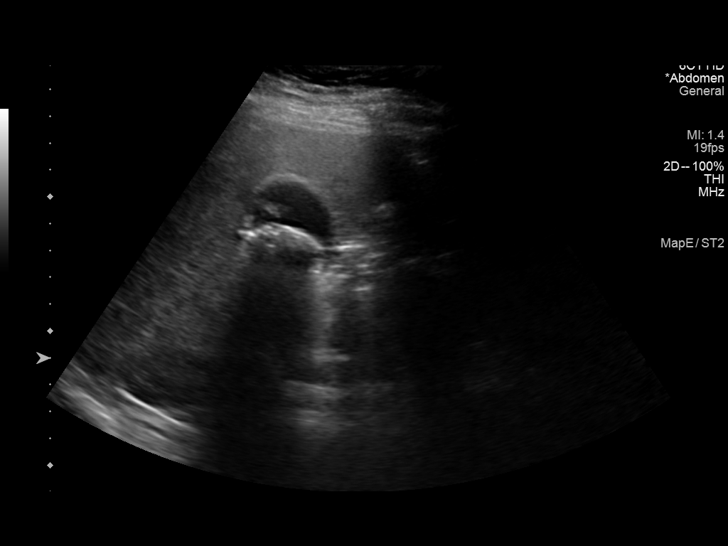
[im 10/40]
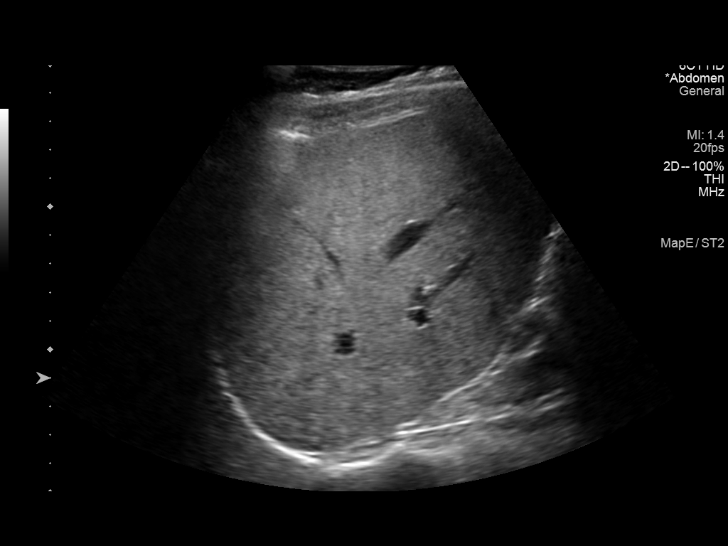
[im 14/40]
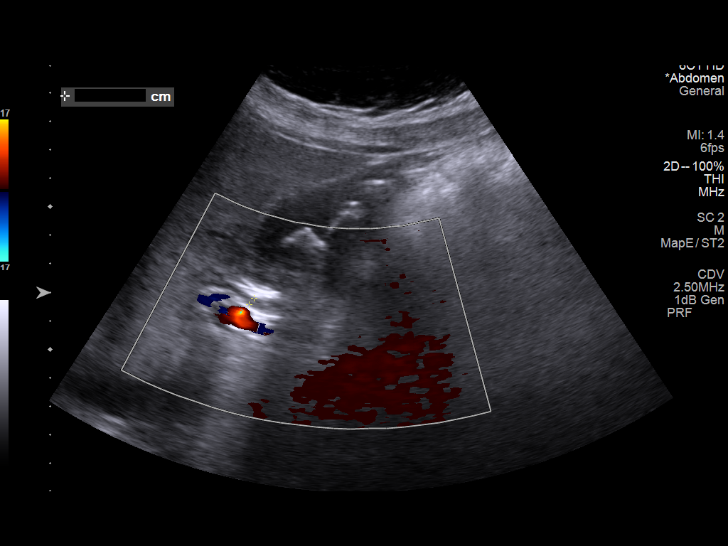
[im 15/40]
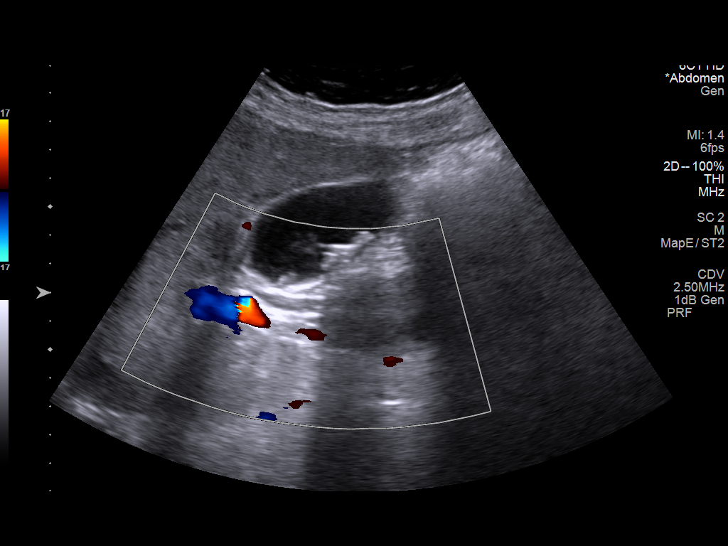
[im 18/40]
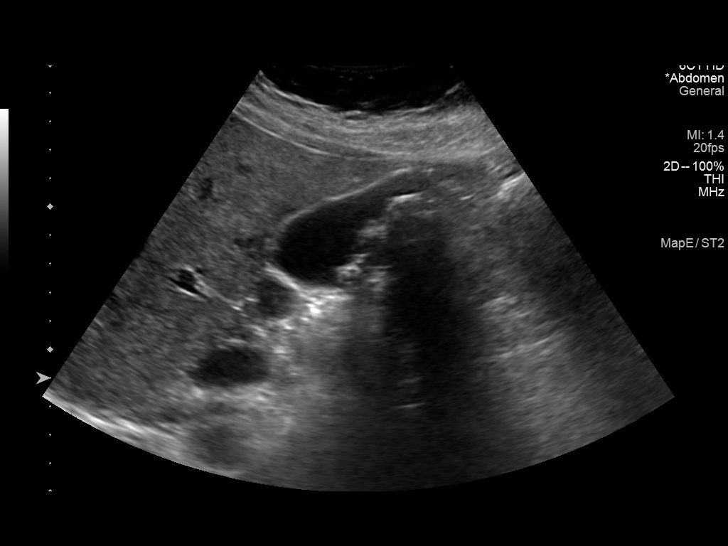
[im 22/40]
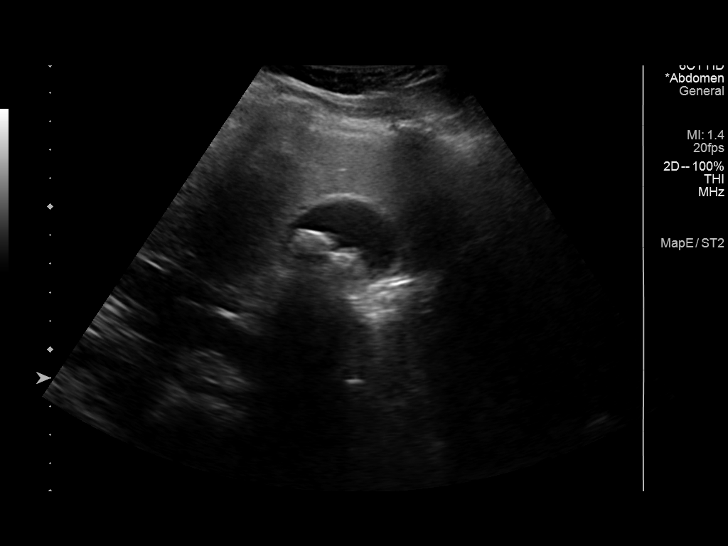
[im 25/40]
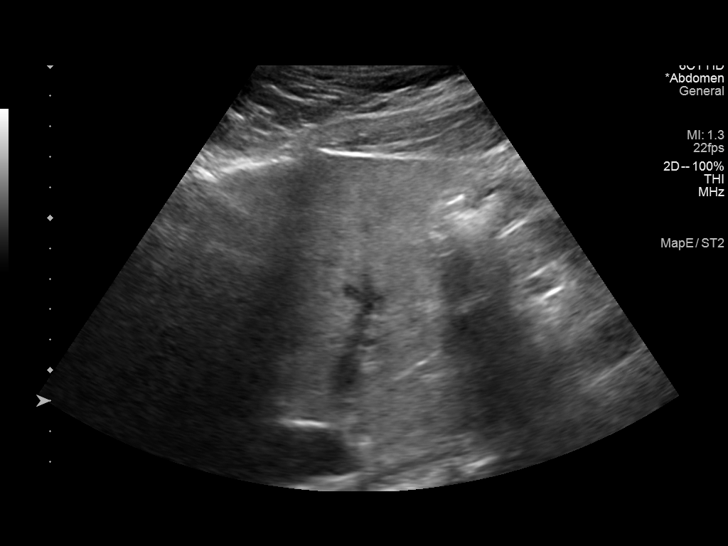
[im 27/40]
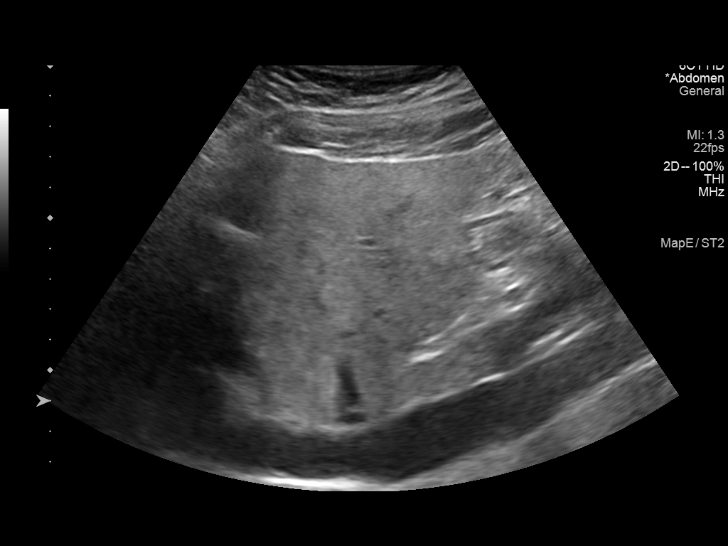
[im 30/40]
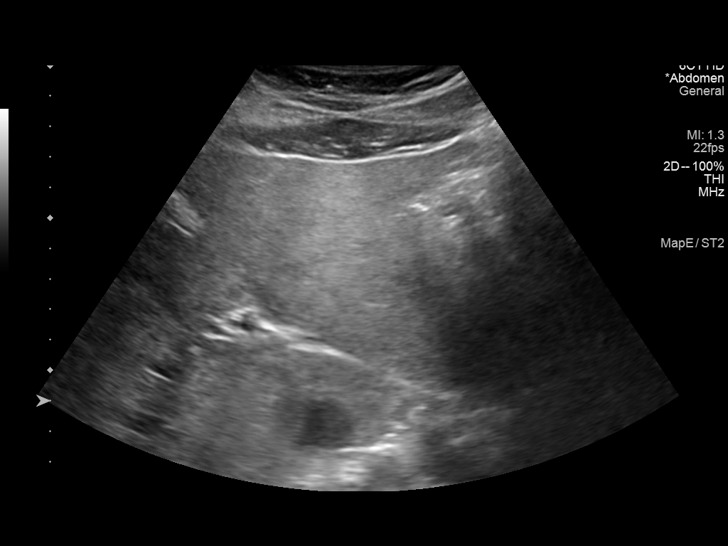
[im 33/40]
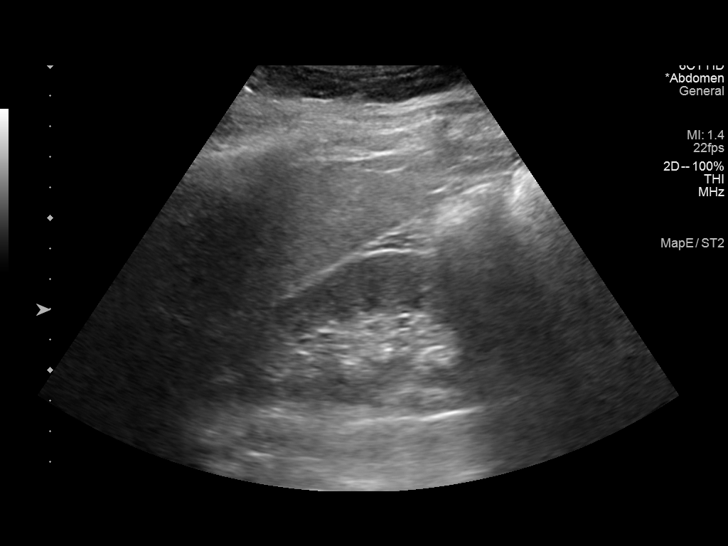
[im 36/40]
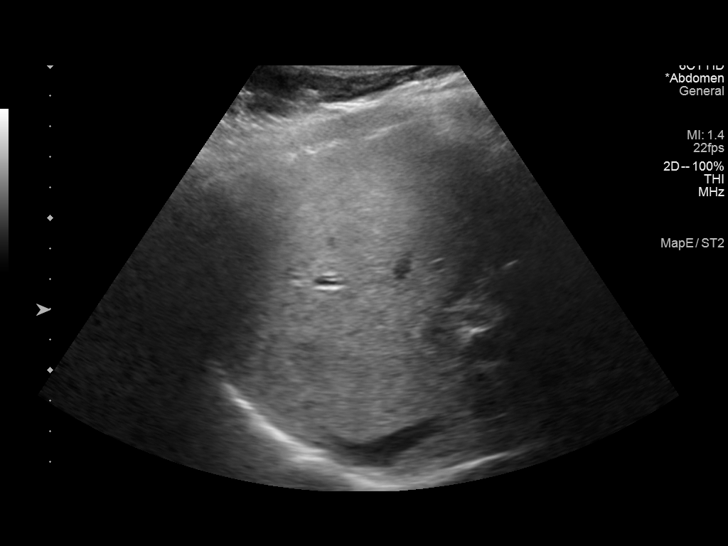
[im 40/40]
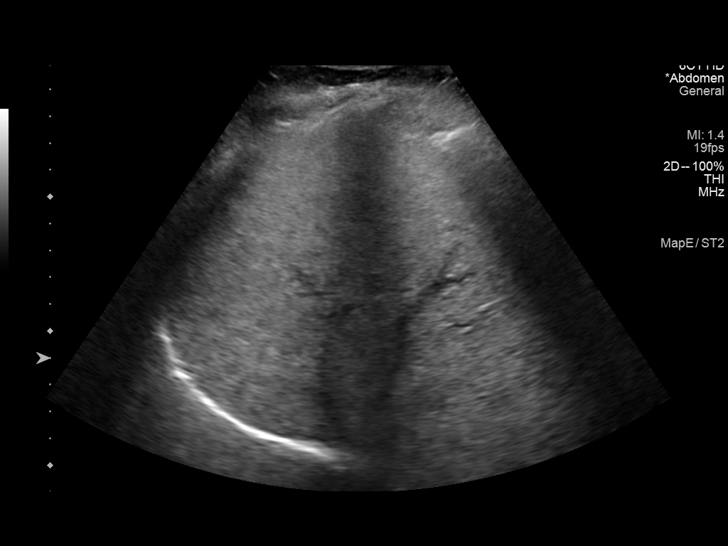

[14 of 25 positions shown; findings below may reference images not displayed]

FINDINGS: Gallbladder:

No wall thickening visualized. Intraluminal calculi measuring up to
2.2 cm. No sonographic Murphy sign noted by sonographer.

Common bile duct:

Diameter: 2.8 mm

Liver:

No focal lesion identified. Increased parenchymal echogenicity.
Portal vein is patent on color Doppler imaging with normal direction
of blood flow towards the liver.

Other: None.
IMPRESSION: Hepatic steatosis.  No focal hepatic lesion.

Cholelithiasis without cholecystitis.

## 2021-06-20 DIAGNOSIS — K760 Fatty (change of) liver, not elsewhere classified: Secondary | ICD-10-CM | POA: Insufficient documentation

## 2021-06-20 DIAGNOSIS — K8020 Calculus of gallbladder without cholecystitis without obstruction: Secondary | ICD-10-CM | POA: Insufficient documentation

## 2023-06-09 ENCOUNTER — Emergency Department (HOSPITAL_COMMUNITY): Payer: Commercial Managed Care - PPO

## 2023-06-09 ENCOUNTER — Emergency Department (HOSPITAL_COMMUNITY)
Admission: EM | Admit: 2023-06-09 | Discharge: 2023-06-09 | Disposition: A | Discharge status: Home / Self Care | Payer: Commercial Managed Care - PPO | Source: Home / Self Care | Attending: Emergency Medicine | Admitting: Emergency Medicine

## 2023-06-09 ENCOUNTER — Other Ambulatory Visit: Payer: Self-pay

## 2023-06-09 DIAGNOSIS — M25552 Pain in left hip: Secondary | ICD-10-CM | POA: Diagnosis not present

## 2023-06-09 LAB — POC URINE PREG, ED: Preg Test, Ur: NEGATIVE

## 2023-06-09 MED ORDER — NAPROXEN 500 MG PO TABS: 500.0000 mg | ORAL_TABLET | Freq: Two times a day (BID) | ORAL | 0 refills | Status: DC

## 2023-06-09 MED ORDER — METHOCARBAMOL 500 MG PO TABS: 500.0000 mg | ORAL_TABLET | Freq: Three times a day (TID) | ORAL | 0 refills | Status: DC | PRN

## 2023-06-09 NOTE — Discharge Instructions (Addendum)
You were evaluated in the Emergency Department and after careful evaluation, we did not find any emergent condition requiring admission or further testing in the hospital.  Your exam/testing today was overall reassuring.  X-ray was normal.  Symptoms likely due to a ligament sprain in the hip/groin.  Use the Naprosyn twice daily for pain.  Can try the Robaxin for more significant pain but use caution as it can cause drowsiness.  Call sports medicine telephone number below to set up an appointment, if you do have persistent symptoms.  Please return to the Emergency Department if you experience any worsening of your condition.  Thank you for allowing [place] to be a part of your care.

## 2023-06-09 NOTE — ED Provider Notes (Signed)
[facility] Emergency Department Provider Note MRN:  [id number]  Arrival date & time: 06/09/23     Chief Complaint   Hip Pain   History of Present Illness   Brenda Richards is a 42 y.o. year-old female with no pertinent past medical history presenting to the ED with chief complaint of hip pain.  Left anterior hip pain for the past 3 weeks.  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  May have injured something.  Her mother-in-law mention that it could be a hernia, here for evaluation.  Review of Systems  A thorough review of systems was obtained and all systems are negative except as noted in the HPI and PMH.   Patient's Health History    Past Medical History:  Diagnosis Date   No pertinent past medical history    Positive PPD, treated     Past Surgical History:  Procedure Laterality Date   CESAREAN SECTION  07/17/2012   Procedure: CESAREAN SECTION;  Surgeon: Catalina Antigua, MD;  Location: [facility];  Service: Gynecology;  Laterality: N/A;   NO PAST SURGERIES      Family History  Problem Relation Age of Onset   Diabetes Mother    Anesthesia problems Neg Hx     Social History   Socioeconomic History   Marital status: Single    Spouse name: Not on file   Number of children: Not on file   Years of education: Not on file   Highest education level: Not on file  Occupational History   Not on file  Tobacco Use   Smoking status: Never   Smokeless tobacco: Never  Vaping Use   Vaping status: Never Used  Substance and Sexual Activity   Alcohol use: Yes    Comment: in early pregnancy   Drug use: No   Sexual activity: Yes    Birth control/protection: None  Other Topics Concern   Not on file  Social History Narrative   Not on file   Social Determinants of Health   Financial Resource Strain: Not on file  Food Insecurity: Not on file  Transportation Needs: Not on file  Physical Activity: Not on file  Stress: Not on file   Social Connections: Not on file  Intimate Partner Violence: Not on file     Physical Exam   Vitals:   06/09/23 0549  BP: (!) 146/92  Pulse: 81  Resp: 18  Temp: 98.4 F (36.9 C)  SpO2: 98%    CONSTITUTIONAL: Well-appearing, NAD NEURO/PSYCH:  Alert and oriented x 3, no focal deficits EYES:  eyes equal and reactive ENT/NECK:  no LAD, no JVD CARDIO: Regular rate, well-perfused, normal S1 and S2 PULM:  CTAB no wheezing or rhonchi GI/GU:  non-distended, non-tender MSK/SPINE:  No gross deformities, no edema SKIN:  no rash, atraumatic   *Additional and/or pertinent findings included in MDM below  Diagnostic and Interventional Summary    EKG Interpretation Date/Time:    Ventricular Rate:    PR Interval:    QRS Duration:    QT Interval:    QTC Calculation:   R Axis:      Text Interpretation:         Labs Reviewed - No data to display  DG Hip Unilat With Pelvis 2-3 Views Left    (Results Pending)    Medications - No data to display   Procedures  /  Critical Care Procedures  ED Course and Medical Decision Making  Initial Impression and Ddx Well-appearing, normal range of motion of the hip, pain is exacerbated when she puts all of her weight on the left foot.  There is no palpable hernia, abdomen completely soft and nontender.  Her tenderness is well localized to the left inguinal ligament.  Suspect MSK strain or injury of some kind.  Past medical/surgical history that increases complexity of ED encounter: None  Interpretation of Diagnostics X-ray pending.    Patient Reassessment and Ultimate Disposition/Management     Signed out to oncoming provider, plan is for discharge.  Patient management required discussion with the following services or consulting groups:  None  Complexity of Problems Addressed Acute complicated illness or Injury  Additional Data Reviewed and Analyzed Further history obtained from: None  Additional Factors Impacting ED  Encounter Risk Prescriptions  Elmer Sow. Pilar Plate, MD [facility] Emergency Medicine [facility] [email or web address]  Final Clinical Impressions(s) / ED Diagnoses     ICD-10-CM   1. Pain of left hip  M25.552       ED Discharge Orders     None        Discharge Instructions Discussed with and Provided to Patient:   Discharge Instructions   None      Sabas Sous, MD 06/09/23 [phone]

## 2023-06-09 NOTE — ED Triage Notes (Signed)
Pt states that she has had left hip pain x 3 weeks. Denies specific injury. Pt states that standing for periods causes worsening of pain. Denies dysuria.

## 2023-06-09 NOTE — ED Notes (Signed)
Assumed care of patient. Patient walked to xray with tech. No distress noted. Waiting on results for dispo.

## 2023-06-09 NOTE — ED Provider Notes (Signed)
Care of patient assumed from Dr. Pilar Plate.  She presents with anterior left hip pain for the past 3 weeks.  Groin pull is suspected.  She is awaiting left hip x-ray.  Discharge is anticipated. Physical Exam  BP 116/72   Pulse 66   Temp 98.3 F (36.8 C) (Oral)   Resp 17   Ht 5' (1.524 m)   Wt 86.2 kg   LMP 05/09/2023 (Exact Date)   SpO2 98%   BMI 37.11 kg/m   Physical Exam Vitals and nursing note reviewed.  Constitutional:      General: She is not in acute distress.    Appearance: Normal appearance. She is well-developed. She is not ill-appearing, toxic-appearing or diaphoretic.  HENT:     Head: Normocephalic and atraumatic.     Right Ear: External ear normal.     Left Ear: External ear normal.     Nose: Nose normal.     Mouth/Throat:     Mouth: Mucous membranes are moist.  Eyes:     Conjunctiva/sclera: Conjunctivae normal.  Cardiovascular:     Rate and Rhythm: Normal rate and regular rhythm.  Pulmonary:     Effort: Pulmonary effort is normal. No respiratory distress.  Abdominal:     General: There is no distension.     Palpations: Abdomen is soft.     Tenderness: There is no abdominal tenderness.  Musculoskeletal:        General: Tenderness present. No swelling or deformity.     Cervical back: Normal range of motion and neck supple.     Right lower leg: No edema.     Left lower leg: No edema.  Skin:    General: Skin is warm and dry.     Coloration: Skin is not jaundiced or pale.  Neurological:     General: No focal deficit present.     Mental Status: She is alert and oriented to person, place, and time.  Psychiatric:        Mood and Affect: Mood normal.        Behavior: Behavior normal.        Thought Content: Thought content normal.        Judgment: Judgment normal.     Procedures  Procedures  ED Course / MDM    Medical Decision Making Risk Prescription drug management.   On assessment, patient is resting comfortably.  She describes the area of pain is  localized to left ASIS.  This area is mildly tender to palpation.  Pain is worsened with passive stretch of hip flexor.  She is able to stand and bear weight without difficulty.  Patient reports that the pain at this time is minimal.  She declines any pain medication.  She states that she came to the ED because is not getting better in the past 3 weeks.  She also felt that the area was swollen.  She states that this is more prominent when she stands up.  When standing, swelling is not appreciable on exam.  There is no abdominal tenderness.  She takes ibuprofen very seldomly, 1-2 times per week.  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  She feels that pain worsens with increased use.  X-ray imaging does not show any acute findings.  I discussed with patient who does feel comfortable with continued supportive care.  She was discharged in good condition.       Gloris Manchester, MD 06/09/23 [phone]

## 2023-09-25 ENCOUNTER — Other Ambulatory Visit: Payer: Self-pay | Admitting: Physician Assistant

## 2023-09-25 DIAGNOSIS — Z1231 Encounter for screening mammogram for malignant neoplasm of breast: Secondary | ICD-10-CM

## 2023-11-14 ENCOUNTER — Ambulatory Visit
Admission: RE | Admit: 2023-11-14 | Discharge: 2023-11-14 | Disposition: A | Discharge status: Home / Self Care | Payer: Commercial Managed Care - PPO | Source: Ambulatory Visit | Attending: Physician Assistant | Admitting: Physician Assistant

## 2023-11-14 DIAGNOSIS — Z1231 Encounter for screening mammogram for malignant neoplasm of breast: Secondary | ICD-10-CM

## 2023-11-19 NOTE — L&D Delivery Note (Addendum)
 Delivery Note At 3:47 PM a viable and healthy female was delivered via Vaginal, Spontaneous (Presentation: Left Occiput Anterior).  APGAR: 8, 9; weight 6 lb 14.1 oz (3120 g).   Placenta status: Spontaneous, Intact.  Cord: 3 vessels with the following complications: None.  Cord pH: None  Anesthesia: Epidural Episiotomy: None Lacerations: None Suture Repair: None Est. Blood Loss (mL): 481  Mom to postpartum.  Baby to Couplet care / Skin to Skin.  Jasmine N Kight 07/17/2024, 5:25 PM   I assumed care from [facility] while awaiting placental delivery.  Placenta delivered spontaneously with gentle cord traction. Fundus firm with massage and Pitocin . Labia, perineum, vagina, and cervix inspected inspected with no tears.  Placenta:  spontaneous Intact Lacerations: none  Newborn Data: Living status:Living Gender:Female Apgars:8 ,9  Weight:3120 g           Barabara Maier, DO FMOB Fellow, Faculty practice [facility], [facility] [employer]

## 2023-11-21 ENCOUNTER — Other Ambulatory Visit: Payer: Self-pay | Admitting: Physician Assistant

## 2023-11-21 DIAGNOSIS — R928 Other abnormal and inconclusive findings on diagnostic imaging of breast: Secondary | ICD-10-CM

## 2023-11-26 ENCOUNTER — Ambulatory Visit
Admission: RE | Admit: 2023-11-26 | Discharge: 2023-11-26 | Disposition: A | Discharge status: Home / Self Care | Payer: Commercial Managed Care - PPO | Source: Ambulatory Visit | Attending: Physician Assistant | Admitting: Physician Assistant

## 2023-11-26 ENCOUNTER — Ambulatory Visit: Payer: Commercial Managed Care - PPO

## 2023-11-26 ENCOUNTER — Other Ambulatory Visit: Payer: Commercial Managed Care - PPO

## 2023-11-26 DIAGNOSIS — R928 Other abnormal and inconclusive findings on diagnostic imaging of breast: Secondary | ICD-10-CM

## 2023-11-29 ENCOUNTER — Other Ambulatory Visit: Payer: Commercial Managed Care - PPO

## 2024-01-08 LAB — GLUCOSE TOLERANCE, 1 HOUR: Glucose, 1 Hour GTT: 123

## 2024-01-08 LAB — OB RESULTS CONSOLE RUBELLA ANTIBODY, IGM: Rubella: IMMUNE

## 2024-01-08 LAB — OB RESULTS CONSOLE PLATELET COUNT: Platelets: 278

## 2024-01-08 LAB — OB RESULTS CONSOLE GC/CHLAMYDIA
Chlamydia: NEGATIVE
Neisseria Gonorrhea: NEGATIVE

## 2024-01-08 LAB — OB RESULTS CONSOLE HEPATITIS B SURFACE ANTIGEN: Hepatitis B Surface Ag: NEGATIVE

## 2024-01-08 LAB — OB RESULTS CONSOLE RPR: RPR: NONREACTIVE

## 2024-01-08 LAB — OB RESULTS CONSOLE HGB/HCT, BLOOD
HCT: 37 (ref 29–41)
Hemoglobin: 12.3

## 2024-01-08 LAB — OB RESULTS CONSOLE HIV ANTIBODY (ROUTINE TESTING): HIV: NONREACTIVE

## 2024-01-08 LAB — OB RESULTS CONSOLE ABO/RH: RH Type: POSITIVE

## 2024-01-08 LAB — OB RESULTS CONSOLE ANTIBODY SCREEN: Antibody Screen: NEGATIVE

## 2024-01-08 LAB — HEPATITIS C ANTIBODY: HCV Ab: NEGATIVE

## 2024-01-13 LAB — CYTOLOGY - PAP: Pap: NEGATIVE

## 2024-01-20 ENCOUNTER — Other Ambulatory Visit: Payer: Self-pay

## 2024-01-20 ENCOUNTER — Ambulatory Visit: Payer: Commercial Managed Care - PPO | Admitting: Obstetrics and Gynecology

## 2024-01-20 ENCOUNTER — Encounter: Payer: Self-pay | Admitting: Obstetrics and Gynecology

## 2024-01-20 ENCOUNTER — Other Ambulatory Visit (HOSPITAL_COMMUNITY)
Admission: RE | Admit: 2024-01-20 | Discharge: 2024-01-20 | Disposition: A | Discharge status: Home / Self Care | Source: Ambulatory Visit | Attending: Obstetrics and Gynecology | Admitting: Obstetrics and Gynecology

## 2024-01-20 VITALS — BP 126/75 | HR 83 | Wt 182.9 lb

## 2024-01-20 DIAGNOSIS — Z1332 Encounter for screening for maternal depression: Secondary | ICD-10-CM

## 2024-01-20 DIAGNOSIS — Z3009 Encounter for other general counseling and advice on contraception: Secondary | ICD-10-CM | POA: Diagnosis not present

## 2024-01-20 DIAGNOSIS — Z3A13 13 weeks gestation of pregnancy: Secondary | ICD-10-CM

## 2024-01-20 DIAGNOSIS — O0990 Supervision of high risk pregnancy, unspecified, unspecified trimester: Secondary | ICD-10-CM | POA: Insufficient documentation

## 2024-01-20 DIAGNOSIS — O09522 Supervision of elderly multigravida, second trimester: Secondary | ICD-10-CM

## 2024-01-20 DIAGNOSIS — Z98891 History of uterine scar from previous surgery: Secondary | ICD-10-CM

## 2024-01-20 DIAGNOSIS — N898 Other specified noninflammatory disorders of vagina: Secondary | ICD-10-CM | POA: Diagnosis present

## 2024-01-20 DIAGNOSIS — O34219 Maternal care for unspecified type scar from previous cesarean delivery: Secondary | ICD-10-CM | POA: Insufficient documentation

## 2024-01-20 DIAGNOSIS — O0992 Supervision of high risk pregnancy, unspecified, second trimester: Secondary | ICD-10-CM

## 2024-01-20 DIAGNOSIS — Z8759 Personal history of other complications of pregnancy, childbirth and the puerperium: Secondary | ICD-10-CM

## 2024-01-20 DIAGNOSIS — O09529 Supervision of elderly multigravida, unspecified trimester: Secondary | ICD-10-CM | POA: Insufficient documentation

## 2024-01-20 NOTE — Addendum Note (Signed)
 Addended by: Maxwell Marion E on: 01/20/2024 12:46 PM   Modules accepted: Orders

## 2024-01-20 NOTE — Progress Notes (Addendum)
 INITIAL PRENATAL VISIT NOTE  Subjective:  Brenda Richards is a 43 y.o. [phone] at [redacted]w[redacted]d by LMP being seen today for her initial prenatal visit. She has an obstetric history significant for 1 x NSVD, 1 x CS for Arrest of Descent at term, then one VBAC at 23 weeks due to PPROM. She has a medical history significant for h/o gallstones that required no treatment.  She is transferring care from [facility].  Patient reports  vaginal discharge .  Contractions: Not present. Vag. Bleeding: None.   . Denies leaking of fluid.    Past Medical History:  Diagnosis Date   No pertinent past medical history    Positive PPD, treated     Past Surgical History:  Procedure Laterality Date   CESAREAN SECTION  07/17/2012   Procedure: CESAREAN SECTION;  Surgeon: Catalina Antigua, MD;  Location: [facility];  Service: Gynecology;  Laterality: N/A;   NO PAST SURGERIES      OB History  Gravida Para Term Preterm AB Living  4 3 2 1  0 3  SAB IAB Ectopic Multiple Live Births  0 0 0 0 3    # Outcome Date GA Lbr Len/2nd Weight Sex Type Anes PTL Lv  4 Current           3 Preterm 08/29/15 [redacted]w[redacted]d 05:40 / 12:47 1 lb 7.3 oz (0.661 kg) M VBAC None  LIV  2 Term 07/17/12 [redacted]w[redacted]d  7 lb 15.5 oz (3.615 kg) M CS-LVertical EPI  LIV  1 Term 2004 [redacted]w[redacted]d 10:00 7 lb (3.175 kg) M Vag-Spont EPI  LIV     Birth Comments: oligohydramnios    Social History   Socioeconomic History   Marital status: Single    Spouse name: Not on file   Number of children: Not on file   Years of education: Not on file   Highest education level: Not on file  Occupational History   Not on file  Tobacco Use   Smoking status: Never   Smokeless tobacco: Never  Vaping Use   Vaping status: Never Used  Substance and Sexual Activity   Alcohol use: Yes    Comment: in early pregnancy   Drug use: No   Sexual activity: Yes    Birth control/protection: None  Other Topics Concern   Not on file  Social History Narrative   Not on file   Social Drivers  of Health   Financial Resource Strain: Not on file  Food Insecurity: Not on file  Transportation Needs: Not on file  Physical Activity: Not on file  Stress: Not on file  Social Connections: Not on file    Family History  Problem Relation Age of Onset   Diabetes Mother    Anesthesia problems Neg Hx      Current Outpatient Medications:    EQ ASPIRIN ADULT LOW DOSE 81 MG tablet, Take 81 mg by mouth daily., Disp: , Rfl:    ondansetron (ZOFRAN) 4 MG tablet, Take 1 tablet 3 times a day by oral route as needed, for pregnancy related nausea., Disp: , Rfl:    Prenatal Vit-Fe Fumarate-FA (PRENATAL MULTIVITAMIN) TABS, Take 1 tablet by mouth every morning., Disp: , Rfl:   No Known Allergies  Review of Systems: Negative except for what is mentioned in HPI.  Objective:   Vitals:   01/20/24 1026  BP: 126/75  Pulse: 83  Weight: 182 lb 14.4 oz (83 kg)    Fetal Status:  Physical Exam: BP 126/75   Pulse 83   Wt 182 lb 14.4 oz (83 kg)   LMP 10/21/2023 (Exact Date)   BMI 35.72 kg/m  CONSTITUTIONAL: Well-developed, well-nourished female in no acute distress.  NEUROLOGIC: Alert and oriented to person, place, and time. Normal reflexes, muscle tone coordination. No cranial nerve deficit noted. PSYCHIATRIC: Normal mood and affect. Normal behavior. Normal judgment and thought content. SKIN: Skin is warm and dry. No rash noted. Not diaphoretic. No erythema. No pallor. HENT:  Normocephalic, atraumatic, External right and left ear normal. Oropharynx is clear and moist EYES: Conjunctivae and EOM are normal. Pupils are equal, round, and reactive to light. No scleral icterus.  NECK: Normal range of motion, supple, no masses CARDIOVASCULAR: Normal heart rate noted, regular rhythm RESPIRATORY: Effort normal, no problems with respiration noted BREASTS: deferred ABDOMEN: Soft, nontender, nondistended GU: deferred MUSCULOSKELETAL: Normal range of motion. EXT:  No edema and no tenderness.    [place]: singleton fetus with normal cardiac activity noted, movement noted  Assessment and Plan:  Pregnancy: G4P2103 at 101w0d by LMP  1. Supervision of high risk pregnancy, antepartum (Primary) - Reviewed [facility] [employer] structure, multiple providers, fellows, medical students, virtual visits, MyChart.  - need to get records from [facility] - reviewed labs available  2. Multigravida of advanced maternal age in second trimester Cont baby ASA started at [facility]  3. H/O: C-section For arrest of descent at term Had subsequent 23 week VBAC  4. Unwanted fertility Would like tubal, will call insurance to see coverage  5. History of preterm premature rupture of membranes At 23 weeks, no known reason Will see MFM No current interventions - [place] MFM OB DETAIL +14 WK; Future  6. Vaginal discharge - Cervicovaginal ancillary only   Preterm labor symptoms and general obstetric precautions including but not limited to vaginal bleeding, contractions, leaking of fluid and fetal movement were reviewed in detail with the patient.  Please refer to After Visit Summary for other counseling recommendations.   Return in about 4 weeks (around 02/17/2024) for high OB.  Conan Bowens 01/20/2024 11:06 AM

## 2024-01-21 LAB — CERVICOVAGINAL ANCILLARY ONLY
Bacterial Vaginitis (gardnerella): POSITIVE — AB
Candida Glabrata: NEGATIVE
Candida Vaginitis: NEGATIVE
Comment: NEGATIVE
Comment: NEGATIVE
Comment: NEGATIVE
Comment: NEGATIVE
Trichomonas: NEGATIVE

## 2024-01-22 ENCOUNTER — Encounter: Payer: Self-pay | Admitting: Family Medicine

## 2024-01-22 ENCOUNTER — Other Ambulatory Visit: Payer: Self-pay | Admitting: Family Medicine

## 2024-01-22 MED ORDER — METRONIDAZOLE 500 MG PO TABS: 500.0000 mg | ORAL_TABLET | Freq: Two times a day (BID) | ORAL | 0 refills | Status: AC

## 2024-01-23 ENCOUNTER — Other Ambulatory Visit: Payer: Self-pay | Admitting: Family Medicine

## 2024-01-26 DIAGNOSIS — O0990 Supervision of high risk pregnancy, unspecified, unspecified trimester: Secondary | ICD-10-CM

## 2024-02-05 DIAGNOSIS — D563 Thalassemia minor: Secondary | ICD-10-CM

## 2024-02-05 HISTORY — DX: Thalassemia minor: D56.3

## 2024-02-17 NOTE — Progress Notes (Unsigned)
   PRENATAL VISIT NOTE  Subjective:  Brenda Richards is a 43 y.o. [phone] at [redacted]w[redacted]d being seen today for ongoing prenatal care.  She is currently monitored for the following issues for this {Blank single:19197::"high-risk","low-risk"} pregnancy and has GERD (gastroesophageal reflux disease); Incompetent cervix; Preterm delivery; Vaginal birth after cesarean (VBAC); Supervision of high risk pregnancy, antepartum; AMA (advanced maternal age) multigravida 35+; H/O: C-section; Unwanted fertility; History of preterm premature rupture of membranes; and Alpha thalassemia silent carrier on their problem list.  Patient reports {sx:[id number]}.   .  .   . Denies leaking of fluid.   The following portions of the patient's history were reviewed and updated as appropriate: allergies, current medications, past family history, past medical history, past social history, past surgical history and problem list.   Objective:  There were no vitals filed for this visit.  Fetal Status:           General:  Alert, oriented and cooperative. Patient is in no acute distress.  Skin: Skin is warm and dry. No rash noted.   Cardiovascular: Normal heart rate noted  Respiratory: Normal respiratory effort, no problems with respiration noted  Abdomen: Soft, gravid, appropriate for gestational age.        Pelvic: {Blank single:19197::"Cervical exam performed in the presence of a chaperone","Cervical exam deferred"}        Extremities: Normal range of motion.     Mental Status: Normal mood and affect. Normal behavior. Normal judgment and thought content.   Assessment and Plan:  Pregnancy: [id number] at [redacted]w[redacted]d 1. Multigravida of advanced maternal age in second trimester (Primary) ***  2. Supervision of high risk pregnancy, antepartum ***  3. Incompetent cervix ***  4. History of preterm premature rupture of membranes ***  5. Unwanted fertility ***  6. Vaginal birth after cesarean (VBAC) ***  {Blank  single:19197::"Term","Preterm"} labor symptoms and general obstetric precautions including but not limited to vaginal bleeding, contractions, leaking of fluid and fetal movement were reviewed in detail with the patient. Please refer to After Visit Summary for other counseling recommendations.   No follow-ups on file.  Future Appointments  Date Time Provider Department Center  02/18/2024  8:15 AM Federico Flake, MD [facility]  03/03/2024  9:00 AM [facility] NURSE [facility]  03/03/2024  9:15 AM [facility] PROVIDER 1 [facility]  03/03/2024  9:30 AM [facility] US2 [facility]  03/03/2024 10:30 AM [facility] GENETIC COUNSELING RM [facility]    Federico Flake, MD

## 2024-02-18 ENCOUNTER — Ambulatory Visit: Admitting: Family Medicine

## 2024-02-18 ENCOUNTER — Other Ambulatory Visit: Payer: Self-pay

## 2024-02-18 VITALS — BP 114/76 | HR 88 | Wt 183.2 lb

## 2024-02-18 DIAGNOSIS — N883 Incompetence of cervix uteri: Secondary | ICD-10-CM | POA: Diagnosis not present

## 2024-02-18 DIAGNOSIS — Z3009 Encounter for other general counseling and advice on contraception: Secondary | ICD-10-CM

## 2024-02-18 DIAGNOSIS — O09522 Supervision of elderly multigravida, second trimester: Secondary | ICD-10-CM | POA: Diagnosis not present

## 2024-02-18 DIAGNOSIS — Z8759 Personal history of other complications of pregnancy, childbirth and the puerperium: Secondary | ICD-10-CM

## 2024-02-18 DIAGNOSIS — Z3A17 17 weeks gestation of pregnancy: Secondary | ICD-10-CM

## 2024-02-18 DIAGNOSIS — O34219 Maternal care for unspecified type scar from previous cesarean delivery: Secondary | ICD-10-CM

## 2024-02-18 DIAGNOSIS — O0992 Supervision of high risk pregnancy, unspecified, second trimester: Secondary | ICD-10-CM

## 2024-02-18 DIAGNOSIS — O0990 Supervision of high risk pregnancy, unspecified, unspecified trimester: Secondary | ICD-10-CM

## 2024-02-27 ENCOUNTER — Telehealth: Payer: Self-pay | Admitting: *Deleted

## 2024-02-27 DIAGNOSIS — O99210 Obesity complicating pregnancy, unspecified trimester: Secondary | ICD-10-CM | POA: Insufficient documentation

## 2024-02-27 NOTE — Telephone Encounter (Signed)
 See Patient Intake Form

## 2024-03-01 ENCOUNTER — Ambulatory Visit: Attending: Obstetrics and Gynecology

## 2024-03-03 ENCOUNTER — Ambulatory Visit: Payer: Commercial Managed Care - PPO | Attending: Nurse Practitioner

## 2024-03-03 ENCOUNTER — Other Ambulatory Visit: Payer: Self-pay | Admitting: Obstetrics and Gynecology

## 2024-03-03 ENCOUNTER — Ambulatory Visit: Admitting: Obstetrics and Gynecology

## 2024-03-03 ENCOUNTER — Ambulatory Visit

## 2024-03-03 ENCOUNTER — Ambulatory Visit: Payer: Commercial Managed Care - PPO

## 2024-03-03 ENCOUNTER — Other Ambulatory Visit: Payer: Self-pay

## 2024-03-03 VITALS — BP 129/77 | HR 93

## 2024-03-03 DIAGNOSIS — O3412 Maternal care for benign tumor of corpus uteri, second trimester: Secondary | ICD-10-CM

## 2024-03-03 DIAGNOSIS — Z8759 Personal history of other complications of pregnancy, childbirth and the puerperium: Secondary | ICD-10-CM | POA: Diagnosis present

## 2024-03-03 DIAGNOSIS — O09892 Supervision of other high risk pregnancies, second trimester: Secondary | ICD-10-CM | POA: Insufficient documentation

## 2024-03-03 DIAGNOSIS — O34219 Maternal care for unspecified type scar from previous cesarean delivery: Secondary | ICD-10-CM | POA: Insufficient documentation

## 2024-03-03 DIAGNOSIS — O0990 Supervision of high risk pregnancy, unspecified, unspecified trimester: Secondary | ICD-10-CM | POA: Insufficient documentation

## 2024-03-03 DIAGNOSIS — O99210 Obesity complicating pregnancy, unspecified trimester: Secondary | ICD-10-CM

## 2024-03-03 DIAGNOSIS — O09522 Supervision of elderly multigravida, second trimester: Secondary | ICD-10-CM | POA: Insufficient documentation

## 2024-03-03 DIAGNOSIS — D563 Thalassemia minor: Secondary | ICD-10-CM | POA: Insufficient documentation

## 2024-03-03 DIAGNOSIS — E6689 Other obesity not elsewhere classified: Secondary | ICD-10-CM | POA: Diagnosis present

## 2024-03-03 DIAGNOSIS — E669 Obesity, unspecified: Secondary | ICD-10-CM

## 2024-03-03 DIAGNOSIS — Z3A19 19 weeks gestation of pregnancy: Secondary | ICD-10-CM

## 2024-03-03 DIAGNOSIS — Z98891 History of uterine scar from previous surgery: Secondary | ICD-10-CM

## 2024-03-03 DIAGNOSIS — O99212 Obesity complicating pregnancy, second trimester: Secondary | ICD-10-CM | POA: Insufficient documentation

## 2024-03-03 DIAGNOSIS — O99012 Anemia complicating pregnancy, second trimester: Secondary | ICD-10-CM | POA: Diagnosis not present

## 2024-03-03 DIAGNOSIS — D259 Leiomyoma of uterus, unspecified: Secondary | ICD-10-CM

## 2024-03-03 NOTE — Progress Notes (Signed)
 [facility] at [facility] [address] Phone:  [phone]   Fax:  [phone]      In-Person Genetic Counseling Clinic Note:   I spoke with 43 y.o. Brenda Richards today to discuss her carrier screening results. She was referred by Alberteen Spindle, NP. She was accompanied by her friend.   Pregnancy History:    [id number]. EGA: [redacted]w[redacted]d by LMP. EDD: 07/27/2024. Brenda Richards has an 70 yo son who was born premature at 23w. She has two other healthy sons. Reports she takes PNVs and baby Aspirin. Denies personal history of diabetes, high blood pressure, thyroid conditions, and seizures. Denies bleeding, infections, and fevers in this pregnancy. Denies using tobacco, alcohol, or street drugs in this pregnancy.   Family History:    A three-generation pedigree was created and scanned into Epic under the Media tab.  Brenda Richards's 17 yo son who was born premature at 23w is currently attending OT, PT, and speech therapy. She reports he was born with a "hole in his heart due to being premature." He is doing well now and has not had genetic testing. We reviewed that congential heart defects can occur due to genetic, multifactorial, or familial causes. It can also be due to premature birth. Recurrence risk for pregnancies with an affected sibling is ~4%; however, this may be higher in the event of a known genetic condition. Without known medical or genetic reports, recurrence risk is difficult to estimate. Moreover, Brenda Richards was informed that the anatomy ultrasound screened the fetus for any structural heart defects.  Maternal ethnicity reported as Hispanic and paternal ethnicity reported as Hispanic. U/k Ashkenazi Jewish ancestry.  Family history not remarkable for consanguinity, individuals with birth defects, intellectual disability, autism spectrum disorder, multiple spontaneous abortions, still births, or unexplained neonatal death.   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 She screened negative for the other three conditions (CF, SMA, and beta-hemoglobinopathies). A negative result on carrier screening reduces but does not eliminate the chance of being a carrier. Please see report for details.  We reviewed the genetics of alpha thalassemia, autosomal recessive mode of inheritance, and clinical features of these conditions. We reviewed that Brenda Richards will either pass down two copies of the alpha globin gene (??) OR one copy of the alpha globin gene (-?) in each pregnancy. Therefore, this pregnancy is not at increased risk for hemoglobin Bart's due to four deletions of the alpha globin genes (--/--) regardless of her reproductive partner's carrier status. The pregnancy will be at increased risk (25%) for hemoglobin H disease if her partner is an alpha thalassemia carrier in the cis configuration (--/??).  Given these results, we discussed and offered carrier screening for Brenda Richards's reproductive partner. We reviewed the benefits and limitations of carrier screening and that it can detect most but not all carriers.  She declined any further testing at this time and elects postnatal testing if indicated.   Newborn Screening. The [place] Newborn Screening (NBS) program will screen all newborn babies for cystic fibrosis, spinal muscular atrophy, hemoglobinopathies, and numerous other conditions. Of note, alpha thalassemia is not included on the NBS.  Advanced Maternal Age:  We briefly discussed that the chance that a fetus would be affected with a chromosome difference increases with advanced maternal age. The chance that Brenda Richards's current pregnancy  would be affected with a chromosome difference based on her age alone is approximately 1 in 56 (~2%). This means that there is a 48 in 49 (~98%) chance that the  fetus would not be affected with a chromosome difference. We discussed that this risk may also be lower given her low-risk NIPS results and normal anatomy ultrasound.  We discussed and offered the option of amniocentesis. The technical aspects, benefits, risks, and limitations were reviewed with the patient including the 1 in 500 risk for miscarriage. We also discussed that testing can be performed postnatally if there is concern for a chromosomal condition in the newborn. Brenda Richards declined amniocentesis.   Previous Testing Completed:  Low risk NIPS: Brenda Richards previously completed noninvasive prenatal screening (NIPS) in this pregnancy. The result is low risk, consistent with a female fetus. This screening significantly reduces but does not eliminate the chance that the current pregnancy has Down syndrome (trisomy 47), trisomy 59, trisomy 59, common sex chromosome conditions, and 22q11.2 microdeletion syndrome. Please see report for details. There are many genetic conditions that cannot be detected by NIPS.    Plan of Care:   Brenda Richards declined FOB carrier screening for alpha thalassemia. She declined amniocentesis. Routine prenatal care.   Informed consent was obtained. All questions were answered.   80 minutes were spent on the date of the encounter in service to the patient including preparation, face-to-face consultation, discussion of test reports and available next steps, pedigree construction, genetic risk assessment, documentation, and care coordination.    Thank you for sharing in the care of Brenda Richards with us .  Please do not hesitate to contact us  at [phone] if you have any questions.   Georgean Kindle, MS, [facility] Certified Genetic Counselor   Genetic counseling student involved in appointment: Yes Johny Nap [place]).

## 2024-03-03 NOTE — Progress Notes (Signed)
 Maternal-Fetal Medicine Consultation Name: Brenda Richards MRN: [id number]  G4 W0981 at 19w 1d gestation.  Patient is here for fetal anatomy scan. Her high-risk pregnancy problems include: -History of preterm delivery at [redacted] weeks gestation. - Previous cesarean delivery. - Advanced maternal age (42 years).  On cell-free fetal DNA screening, the risks of fetal aneuploidies are not increased. -Silent carrier for alpha thalassemia.  Patient met with our genetic counselor today after ultrasound.  Obstetrical history is significant for a term vaginal delivery (2004) followed by a term cesarean delivery (2013).  2016, at [redacted] weeks gestation, patient had lower abdominal pressure and on evaluation, the cervix was dilated.  She had ruptured membranes and had a vaginal delivery of a female infant weighing 1 pounds and 7 ounces at birth.  [facility].  Ultrasound We performed fetal anatomical survey.  Amniotic fluid is normal good fetal activity seen.  No markers of aneuploidies or obvious fetal structural defects is.  Fetal biometry is consistent with the previously established dates. Because of a history of preterm delivery at [redacted] weeks gestation, we performed a transvaginal ultrasound to evaluate the cervix.  The cervix measures 3.6 cm, which is normal.  No shortening or funneling was seen on transfundal pressure. As maternal obesity was a limitation on the resolution of images, fetal anomalies may be missed.  Or concerns include History of preterm delivery Patient reports she did not have any cervical surgeries after her second childbirth.  Patientfever or rashes before preterm delivery. I counseled the patient that history of preterm delivery increases the risk of preterm delivery and subsequent pregnancies.  It is difficult to diagnose cervical incompetence from her history.  Other causes include subclinical infection leading amniotic membrane rupture. I recommended  cervical length measurement in 2 or 4 weeks to rule out cervical insufficiency.  I informed her that if significant cervical shortening is seen, we will recommend cerclage.  Advanced maternal age Advanced maternal age is associated with increased risk of fetal chromosomal anomalies.  I discussed the significance of limitations of cell free fetal DNA screening that has a greater detection rate for fetal Down syndrome. AMA after 40 years is associated with a small increased risk of stillbirth.  We recommend weekly antenatal testing from [redacted] weeks gestation until delivery.  Previous cesarean delivery Patient had low transverse cesarean section in the second pregnancy.  I counseled the patient that repeat cesarean deliveries increase the risk of placenta previa and/or placenta accreta spectrum.  I briefly discussed the benefit of VBAC that is associated with less than 1% of scar dehiscence.  Vaginal delivery reduces the complication of hemorrhage, infection is normal, which are associated with cesarean delivery. Patient is keen on VBAC.  After genetic counseling, patient opted not to have partner screening.   Recommendations - An appointment was made for birth control 2 weeks and 4 weeks for cervical length measurements. -Completion of fetal anatomy in 4 weeks. - Fetal growth assessment at 32 weeks and [redacted] weeks gestation. - Weekly antenatal testing at [redacted] weeks gestation till delivery.  Consultation including face-to-face (more than 50%) counseling 45 minutes.

## 2024-03-17 ENCOUNTER — Inpatient Hospital Stay (HOSPITAL_COMMUNITY)

## 2024-03-17 ENCOUNTER — Inpatient Hospital Stay (HOSPITAL_COMMUNITY)
Admission: AD | Admit: 2024-03-17 | Discharge: 2024-03-17 | Disposition: A | Discharge status: Home / Self Care | Attending: Obstetrics & Gynecology | Admitting: Obstetrics & Gynecology

## 2024-03-17 ENCOUNTER — Encounter (HOSPITAL_COMMUNITY): Payer: Self-pay | Admitting: Obstetrics & Gynecology

## 2024-03-17 DIAGNOSIS — D259 Leiomyoma of uterus, unspecified: Secondary | ICD-10-CM

## 2024-03-17 DIAGNOSIS — Z3A21 21 weeks gestation of pregnancy: Secondary | ICD-10-CM

## 2024-03-17 DIAGNOSIS — O26892 Other specified pregnancy related conditions, second trimester: Secondary | ICD-10-CM | POA: Diagnosis not present

## 2024-03-17 DIAGNOSIS — Z8279 Family history of other congenital malformations, deformations and chromosomal abnormalities: Secondary | ICD-10-CM

## 2024-03-17 DIAGNOSIS — O09292 Supervision of pregnancy with other poor reproductive or obstetric history, second trimester: Secondary | ICD-10-CM | POA: Insufficient documentation

## 2024-03-17 DIAGNOSIS — Z148 Genetic carrier of other disease: Secondary | ICD-10-CM | POA: Insufficient documentation

## 2024-03-17 DIAGNOSIS — O3412 Maternal care for benign tumor of corpus uteri, second trimester: Secondary | ICD-10-CM

## 2024-03-17 DIAGNOSIS — O99212 Obesity complicating pregnancy, second trimester: Secondary | ICD-10-CM | POA: Diagnosis not present

## 2024-03-17 DIAGNOSIS — O09522 Supervision of elderly multigravida, second trimester: Secondary | ICD-10-CM | POA: Insufficient documentation

## 2024-03-17 DIAGNOSIS — R1084 Generalized abdominal pain: Secondary | ICD-10-CM

## 2024-03-17 DIAGNOSIS — O09212 Supervision of pregnancy with history of pre-term labor, second trimester: Secondary | ICD-10-CM | POA: Insufficient documentation

## 2024-03-17 DIAGNOSIS — O99893 Other specified diseases and conditions complicating puerperium: Secondary | ICD-10-CM | POA: Diagnosis not present

## 2024-03-17 DIAGNOSIS — Z113 Encounter for screening for infections with a predominantly sexual mode of transmission: Secondary | ICD-10-CM | POA: Diagnosis present

## 2024-03-17 DIAGNOSIS — O34219 Maternal care for unspecified type scar from previous cesarean delivery: Secondary | ICD-10-CM | POA: Diagnosis present

## 2024-03-17 DIAGNOSIS — R109 Unspecified abdominal pain: Secondary | ICD-10-CM | POA: Diagnosis present

## 2024-03-17 LAB — URINALYSIS, ROUTINE W REFLEX MICROSCOPIC
Bilirubin Urine: NEGATIVE
Glucose, UA: NEGATIVE mg/dL
Ketones, ur: NEGATIVE mg/dL
Nitrite: NEGATIVE
Protein, ur: NEGATIVE mg/dL
Specific Gravity, Urine: 1.019 (ref 1.005–1.030)
pH: 5 (ref 5.0–8.0)

## 2024-03-17 LAB — WET PREP, GENITAL
Clue Cells Wet Prep HPF POC: NONE SEEN
Sperm: NONE SEEN
Trich, Wet Prep: NONE SEEN
WBC, Wet Prep HPF POC: <10 (ref <10)
Yeast Wet Prep HPF POC: NONE SEEN

## 2024-03-17 LAB — URINALYSIS, MICROSCOPIC (REFLEX)

## 2024-03-17 MED ORDER — ACETAMINOPHEN 500 MG PO TABS: 1000.0000 mg | ORAL_TABLET | Freq: Four times a day (QID) | ORAL | 2 refills | Status: DC | PRN

## 2024-03-17 MED ORDER — IBUPROFEN 600 MG PO TABS: 600.0000 mg | ORAL_TABLET | Freq: Three times a day (TID) | ORAL | 1 refills | Status: DC | PRN

## 2024-03-17 MED ORDER — IBUPROFEN 600 MG PO TABS: 600.0000 mg | ORAL_TABLET | Freq: Four times a day (QID) | ORAL | Status: DC | PRN

## 2024-03-17 NOTE — MAU Note (Signed)
..  Brenda Richards is a 43 y.o. at [redacted]w[redacted]d here in MAU reporting: abdominal pain that feels like contractions but is constant and feels like pressure.  Was told she has "stones" and is concerned this is creating more pain.  +FM.  Denies vaginal bleeding or leaking of fluid.   Pain score: 10/10 Vitals:   03/17/24 2224  BP: (!) 142/80  Pulse: 88  Resp: 18  Temp: 98 F (36.7 C)  SpO2: 100%     FHT:155 Lab orders placed from triage: none

## 2024-03-17 NOTE — MAU Provider Note (Signed)
 Obstetric Attending MAU Note  Chief Complaint:  Abdominal Pain   Event Date/Time   First Provider Initiated Contact with Patient 03/17/24 2334     HPI: Brenda Richards is a 43 y.o. [id number] at [redacted]w[redacted]d who presents to maternity admissions reporting pain and pelvic pressure.  History of PPROM/preterm delivery at 23 weeks, she is very worried.  Was seen in MFM on 4/16, cervical length was 3.6 cm, normal AFV.  Pain radiates to her back.  Denies contractions, leakage of fluid or vaginal bleeding. Good fetal movement.  Denies any abnormal vaginal discharge, fevers, chills, sweats, dysuria, nausea, vomiting, other GI or GU symptoms or other general symptoms.  Pregnancy Course: Receives care at [facility] Patient Active Problem List   Diagnosis Date Noted   History of other child with congenital heart defect 03/17/2024   Obesity affecting pregnancy, antepartum 02/27/2024   Alpha thalassemia silent carrier 02/05/2024   Supervision of high risk pregnancy, antepartum 01/20/2024   AMA (advanced maternal age) multigravida 35+ 01/20/2024   History of cesarean section followed by successful vaginal birth (VBAC) 01/20/2024   Unwanted fertility 01/20/2024   History of preterm delivery/PPROM at 23 weeks 08/31/2015   History of cervical incompetence 08/28/2015   GERD (gastroesophageal reflux disease) 07/06/2015    Past Medical History:  Diagnosis Date   Alpha thalassemia silent carrier 02/05/2024   Positive PPD, treated     OB History  Gravida Para Term Preterm AB Living  4 3 2 1  0 3  SAB IAB Ectopic Multiple Live Births  0 0 0 0 3    # Outcome Date GA Lbr Len/2nd Weight Sex Type Anes PTL Lv  4 Current           3 Preterm 08/29/15 [redacted]w[redacted]d 05:40 / 12:47 661 g M VBAC None  LIV  2 Term 07/17/12 [redacted]w[redacted]d  3615 g M CS-LVertical EPI  LIV  1 Term 2004 [redacted]w[redacted]d 10:00 3175 g M Vag-Spont EPI  LIV     Birth Comments: oligohydramnios    Past Surgical History:  Procedure Laterality Date    CESAREAN SECTION  07/17/2012   Procedure: CESAREAN SECTION;  Surgeon: Verlyn Goad, MD;  Location: [facility];  Service: Gynecology;  Laterality: N/A;    Family History: Family History  Problem Relation Age of Onset   Diabetes Mother    Anesthesia problems Neg Hx     Social History: Social History   Tobacco Use   Smoking status: Never   Smokeless tobacco: Never  Vaping Use   Vaping status: Never Used  Substance Use Topics   Alcohol use: Not Currently    Comment: in early pregnancy   Drug use: No    Allergies: No Known Allergies  Medications Prior to Admission  Medication Sig Dispense Refill Last Dose/Taking   EQ ASPIRIN ADULT LOW DOSE 81 MG tablet Take 81 mg by mouth daily.      ondansetron  (ZOFRAN ) 4 MG tablet Take 1 tablet 3 times a day by oral route as needed, for pregnancy related nausea.      Prenatal Vit-Fe Fumarate-FA (PRENATAL MULTIVITAMIN) TABS Take 1 tablet by mouth every morning.       ROS: Pertinent findings in history of present illness.  Physical Exam  Blood pressure 126/77, pulse 84, temperature 98 F (36.7 C), temperature source Oral, resp. rate 18, height 5\' 4"  (1.626 m), weight 85.5 kg, last menstrual period 10/21/2023, SpO2 99%. CONSTITUTIONAL: Well-developed, well-nourished female in no acute distress.  HENT:  Normocephalic, atraumatic,  External right and left ear normal. Oropharynx is clear and moist EYES: Conjunctivae and EOM are normal. Pupils are equal, round, and reactive to light. No scleral icterus.  NECK: Normal range of motion, supple, no masses SKIN: Skin is warm and dry. No rash noted. Not diaphoretic. No erythema. No pallor. NEUROLGIC: Alert and oriented to person, place, and time. Normal reflexes, muscle tone coordination. No cranial nerve deficit noted. PSYCHIATRIC: Normal mood and affect. Normal behavior. Normal judgment and thought content. CARDIOVASCULAR: Normal heart rate noted RESPIRATORY: Effort and breath sounds normal, no problems  with respiration noted ABDOMEN: Soft, nontender, nondistended, gravid appropriate for gestational age MUSCULOSKELETAL: Normal range of motion. No edema and no tenderness. 2+ distal pulses. SPECULUM EXAM: Deferred   FHR:  155bpm    Labs: Results for orders placed or performed during the hospital encounter of 03/17/24 (from the past 24 hours)  Urinalysis, Routine w reflex microscopic -Urine, Clean Catch     Status: Abnormal   Collection Time: 03/17/24  9:55 PM  Result Value Ref Range   Color, Urine YELLOW YELLOW   APPearance HAZY (A) CLEAR   Specific Gravity, Urine 1.019 1.005 - 1.030   pH 5.0 5.0 - 8.0   Glucose, UA NEGATIVE NEGATIVE mg/dL   Hgb urine dipstick SMALL (A) NEGATIVE   Bilirubin Urine NEGATIVE NEGATIVE   Ketones, ur NEGATIVE NEGATIVE mg/dL   Protein, ur NEGATIVE NEGATIVE mg/dL   Nitrite NEGATIVE NEGATIVE   Leukocytes,Ua TRACE (A) NEGATIVE  Urinalysis, Microscopic (reflex)     Status: Abnormal   Collection Time: 03/17/24  9:55 PM  Result Value Ref Range   RBC / HPF 21-50 0 - 5 RBC/hpf   WBC, UA 0-5 0 - 5 WBC/hpf   Bacteria, UA RARE (A) NONE SEEN   Squamous Epithelial / HPF 6-10 0 - 5 /HPF   Mucus PRESENT   Wet prep, genital     Status: None   Collection Time: 03/17/24 10:19 PM   Specimen: Vaginal  Result Value Ref Range   Yeast Wet Prep HPF POC NONE SEEN NONE SEEN   Trich, Wet Prep NONE SEEN NONE SEEN   Clue Cells Wet Prep HPF POC NONE SEEN NONE SEEN   WBC, Wet Prep HPF POC <10 <10   Sperm NONE SEEN     Imaging:  03/17/24  (Preliminary report)  Normal AFV, cervical length 4.5 cm, pain noted over 4 cm uterine fibroid on her left side  US  MFM OB DETAIL +14 WK Result Date: 03/03/2024 ----------------------------------------------------------------------  OBSTETRICS REPORT                       (Signed Final 03/03/2024 03:02 pm) ---------------------------------------------------------------------- Patient Info  ID #:       [id number]                          D.O.B.:   1981/06/22 (42 yrs)(F)  Name:       Brenda Richards                 Visit Date: 03/03/2024 09:05 am              [facility] ---------------------------------------------------------------------- Performed By  Attending:        Cassandria Clever MD        Ref. Address:     [address]  [place], [place]                                                             [address]  Performed By:     Earnest Goad            [facility]  Referred By:      Jan Mcgill                    MD ---------------------------------------------------------------------- Orders  #  Description                           Code        Ordered By  1  US  MFM OB DETAIL +14 WK               76811.01    KELLY DAVIS  2  US  MFM OB TRANSVAGINAL                60454.0     KELLY DAVIS ----------------------------------------------------------------------  #  Order #                     Accession #                Episode #  1  [id number]                   [id number]                 [id number]  2  [id number]                   [id number]                 [id number] ---------------------------------------------------------------------- Indications  Advanced maternal age multigravida 33+,        O46.522  second trimester (43 yo)  Poor obstetric history: Previous preterm       O09.219  delivery, antepartum (PPROM [redacted]w[redacted]d)  Genetic carrier (Silent Carrier Alpha Thal)    Z14.8  Obesity complicating pregnancy, second         O99.212  trimester (BMI  34)  History of cesarean delivery, currently        O34.219  pregnant  Encounter for antenatal screening for          Z36.3  malformations  [redacted] weeks gestation of pregnancy                Z3A.19  LR NIPS  Uterine fibroids affecting pregnancy in         O34.12, D25.9  second trimester, antepartum ---------------------------------------------------------------------- Fetal Evaluation  Num Of Fetuses:         1  Fetal Heart Rate(bpm):  147  Cardiac Activity:       Observed  Presentation:           Cephalic  Placenta:               Posterior  P. Cord Insertion:      Visualized, central  Amniotic Fluid  AFI FV:      Within normal limits                              Largest Pocket(cm)                              4.76 ---------------------------------------------------------------------- Biometry  BPD:      42.2  mm     G. Age:  18w 5d         34  %    CI:        73.66   %    70 - 86                                                          FL/HC:      18.5   %    16.1 - 18.3  HC:      156.2  mm     G. Age:  18w 4d         16  %    HC/AC:      1.12        1.09 - 1.39  AC:      139.6  mm     G. Age:  19w 2d         53  %    FL/BPD:     68.5   %  FL:       28.9  mm     G. Age:  18w 6d         33  %    FL/AC:      20.7   %    20 - 24  HUM:      27.9  mm     G. Age:  19w 0d         46  %  CER:      18.9  mm     G. Age:  18w 4d         16  %  NFT:       5.4  mm  LV:        6.1  mm  CM:        3.7  mm  Est. FW:     272  gm    0 lb 10 oz      41  % ---------------------------------------------------------------------- OB History  Gravidity:    4         Term:   2        Prem:   1  Living:       3 ---------------------------------------------------------------------- Gestational Age  LMP:           19w 1d        Date:  10/21/23                  EDD:   07/27/24  U/S Today:     18w 6d                                        EDD:   07/29/24  Best:          19w 1d     Det. By:  LMP  (10/21/23)          EDD:   07/27/24 ---------------------------------------------------------------------- Targeted Anatomy  Central Nervous System  Calvarium/Cranial V.:  Appears normal         Cereb./Vermis:          Appears normal  Cavum:                 Appears normal         Cisterna Magna:          Appears normal  Lateral Ventricles:    Appears normal         Midline Falx:           Appears normal  Choroid Plexus:        Appears normal  Spine  Cervical:              Appears normal         Sacral:                 Appears normal  Thoracic:              Appears normal         Shape/Curvature:        Appears normal  Lumbar:                Appears normal  Head/Neck  Lips:                  Appears normal         Profile:                Appears normal  Neck:                  Appears normal         Orbits/Eyes:            Appears normal  Nuchal Fold:           Appears normal         Mandible:               Appears normal  Nasal Bone:            Present                Maxilla:                Appears normal  Thorax  4 Chamber View:  Not well visualized    Interventr. Septum:     Appears normal  Cardiac Rhythm:        Normal                 Cardiac Axis:           Normal  Cardiac Situs:         Appears normal         Diaphragm:              Appears normal  Rt Outflow Tract:      Not well visualized    3 Vessel View:          Not well visualized  Lt Outflow Tract:      Not well visualized    3 V Trachea View:       Not well visualized  Aortic Arch:           Appears normal         IVC:                    Appears normal  Ductal Arch:           Appears normal         Crossing:               Not well visualized  SVC:                   Appears normal  Abdomen  Ventral Wall:          Appears normal         Lt Kidney:              Appears normal  Cord Insertion:        Appears normal         Rt Kidney:              Appears normal  Situs:                 Appears normal         Bladder:                Appears normal  Stomach:               Appears normal  Extremities  Lt Humerus:            Appears normal         Lt Femur:               Appears normal  Rt Humerus:            Appears normal         Rt Femur:               Appears normal  Lt Forearm:            Appears normal         Lt Lower Leg:           Appears normal  Rt  Forearm:            Appears normal         Rt Lower Leg:           Appears normal  Lt Hand:               Open hand nml          Lt Foot:  Nml foot  Rt Hand:               Open hand nml          Rt Foot:                Nml foot  Other  Umbilical Cord:        Normal 3-vessel        Genitalia:              Female-nml ---------------------------------------------------------------------- Cervix Uterus Adnexa  Cervix  Length:            3.6  cm.  Normal appearance by transvaginal scan  Uterus  Single fibroid noted, see table below.  Right Ovary  Size(cm)     2.18   x   2.17   x  1.18      Vol(ml): 2.92  Within normal limits.  Left Ovary  Size(cm)        2   x   2.54   x  1.08      Vol(ml): 2.87  Within normal limits.  Cul De Sac  No free fluid seen.  Adnexa  No adnexal mass visualized ---------------------------------------------------------------------- Myomas  Site                     L(cm)      W(cm)      D(cm)       Location  Ant Lt                   4.25       3.25       4 ----------------------------------------------------------------------  Blood Flow                  RI       PI       Comments ---------------------------------------------------------------------- Impression  G4 P2103 at 19w 1d gestation.  Patient is here for fetal  anatomy scan.  Her high-risk pregnancy problems include:  -History of preterm delivery at [redacted] weeks gestation.  - Previous cesarean delivery.  - Advanced maternal age (42 years).  On cell-free fetal DNA  screening, the risks of fetal aneuploidies are not increased.  -Silent carrier for alpha thalassemia.  Patient met with our  genetic counselor today after ultrasound.  Obstetrical history is significant for a term vaginal delivery  (2004) followed by a term cesarean delivery (2013).  2016, at  [redacted] weeks gestation, patient had lower abdominal pressure  and on evaluation, the cervix was dilated.  She had ruptured  membranes and had a vaginal delivery of a female infant   weighing 1 pounds and 7 ounces at birth.  Her son is [facility].  Ultrasound  We performed fetal anatomical survey.  Amniotic fluid is  normal good fetal activity seen.  No markers of aneuploidies  or obvious fetal structural defects is.  Fetal biometry is  consistent with the previously established dates.  Because of a history of preterm delivery at [redacted] weeks  gestation, we performed a transvaginal ultrasound to  evaluate the cervix.  The cervix measures 3.6 cm, which is  normal.  No shortening or funneling was seen on transfundal  pressure.  As maternal obesity was a limitation on the resolution of  images, fetal anomalies may be missed.  Or concerns include  History of preterm delivery  Patient reports she did not have any cervical surgeries after  her second childbirth.  Patientfever or rashes before preterm  delivery.  I counseled the patient that history of preterm delivery  increases the risk of preterm delivery and subsequent  pregnancies.  It is difficult to diagnose cervical incompetence  from her history.  Other causes include subclinical infection  leading amniotic membrane rupture.  I recommended cervical length measurement in 2 or 4 weeks  to rule out cervical insufficiency.  I informed her that if  significant cervical shortening is seen, we will recommend  cerclage.  Advanced maternal age  Advanced maternal age is associated with increased risk of  fetal chromosomal anomalies.  I discussed the significance of  limitations of cell free fetal DNA screening that has a greater  detection rate for fetal Down syndrome.  AMA after 40 years is associated with a small increased risk  of stillbirth.  We recommend weekly antenatal testing from [redacted]  weeks gestation until delivery.  Previous cesarean delivery  Patient had low transverse cesarean section in the second  pregnancy.  I counseled the patient that repeat cesarean  deliveries increase the risk of placenta previa and/or placenta   accreta spectrum.  I briefly discussed the benefit of VBAC  that is associated with less than 1% of scar dehiscence.  Vaginal delivery reduces the complication of hemorrhage,  infection is normal, which are associated with cesarean  delivery.  Patient is keen on VBAC.  After genetic counseling, patient opted not to have partner  screening. ---------------------------------------------------------------------- Recommendations  - An appointment was made for birth control 2 weeks and 4  weeks for cervical length measurements.  -Completion of fetal anatomy in 4 weeks.  - Fetal growth assessment at 32 weeks and [redacted] weeks  gestation.  - Weekly antenatal testing at [redacted] weeks gestation till delivery. ----------------------------------------------------------------------                 Cassandria Clever, MD Electronically Signed Final Report   03/03/2024 03:02 pm ----------------------------------------------------------------------   US  MFM OB Transvaginal Result Date: 03/03/2024 ----------------------------------------------------------------------  OBSTETRICS REPORT                       (Signed Final 03/03/2024 03:02 pm) ---------------------------------------------------------------------- Patient Info  ID #:       [id number]                          D.O.B.:  04/18/1981 (42 yrs)(F)  Name:       Brenda Richards                 Visit Date: 03/03/2024 09:05 am              [facility] ---------------------------------------------------------------------- Performed By  Attending:        Cassandria Clever MD        Ref. Address:     [address]                                                             [place], [place]  [address]  Performed By:     Earnest Goad            Location[facility]  Referred By:      Jan Mcgill                    MD ---------------------------------------------------------------------- Orders  #  Description                           Code        Ordered By  1  US  MFM OB DETAIL +14 WK               76811.01    KELLY DAVIS  2  US  MFM OB TRANSVAGINAL                82956.2     KELLY DAVIS ----------------------------------------------------------------------  #  Order #                     Accession #                Episode #  1  [id number]                   [id number]                 [id number]  2  [id number]                   [id number]                 [id number] ---------------------------------------------------------------------- Indications  Advanced maternal age multigravida 37+,        O16.522  second trimester (43 yo)  Poor obstetric history: Previous preterm       O09.219  delivery, antepartum (PPROM [redacted]w[redacted]d)  Genetic carrier (Silent Carrier Alpha Thal)    Z14.8  Obesity complicating pregnancy, second         O99.212  trimester (BMI 34)  History of cesarean delivery, currently        O34.219  pregnant  Encounter for antenatal screening for          Z36.3  malformations  [redacted] weeks gestation of pregnancy                Z3A.19  LR NIPS  Uterine fibroids affecting pregnancy in        O34.12, D25.9  second trimester, antepartum ---------------------------------------------------------------------- Fetal Evaluation  Num Of Fetuses:         1  Fetal Heart Rate(bpm):  147  Cardiac Activity:       Observed  Presentation:           Cephalic  Placenta:               Posterior  P. Cord Insertion:      Visualized, central  Amniotic Fluid  AFI FV:      Within normal limits                              Largest Pocket(cm)                              4.76 ---------------------------------------------------------------------- Biometry  BPD:      42.2  mm     G. Age:  18w 5d         34  %    CI:        73.66   %    70 - 86                                                           FL/HC:      18.5   %    16.1 - 18.3  HC:      156.2  mm     G. Age:  18w 4d         16  %    HC/AC:      1.12        1.09 - 1.39  AC:      139.6  mm     G. Age:  19w 2d         53  %    FL/BPD:     68.5   %  FL:       28.9  mm     G. Age:  18w 6d         33  %    FL/AC:      20.7   %    20 - 24  HUM:      27.9  mm     G. Age:  19w 0d         46  %  CER:      18.9  mm     G. Age:  18w 4d         16  %  NFT:       5.4  mm  LV:        6.1  mm  CM:        3.7  mm  Est. FW:     272  gm    0 lb 10 oz      41  % ---------------------------------------------------------------------- OB History  Gravidity:    4         Term:   2        Prem:   1  Living:       3 ---------------------------------------------------------------------- Gestational Age  LMP:           19w 1d        Date:  10/21/23                  EDD:   07/27/24  U/S Today:     18w 6d                                        EDD:   07/29/24  Best:          19w 1d     Det. By:  LMP  (10/21/23)          EDD:   07/27/24 ---------------------------------------------------------------------- Targeted Anatomy  Central Nervous System  Calvarium/Cranial V.:  Appears normal         Cereb./Vermis:          Appears normal  Cavum:                 Appears normal         Cisterna Magna:         Appears normal  Lateral Ventricles:    Appears normal         Midline Falx:           Appears normal  Choroid Plexus:        Appears normal  Spine  Cervical:              Appears normal         Sacral:                 Appears normal  Thoracic:              Appears normal         Shape/Curvature:        Appears normal  Lumbar:                Appears normal  Head/Neck  Lips:                  Appears normal         Profile:                Appears normal  Neck:                  Appears normal         Orbits/Eyes:            Appears normal  Nuchal Fold:           Appears normal         Mandible:               Appears normal  Nasal Bone:            Present                Maxilla:                 Appears normal  Thorax  4 Chamber View:        Not well visualized    Interventr. Septum:     Appears normal  Cardiac Rhythm:        Normal                 Cardiac Axis:           Normal  Cardiac Situs:  Appears normal         Diaphragm:              Appears normal  Rt Outflow Tract:      Not well visualized    3 Vessel View:          Not well visualized  Lt Outflow Tract:      Not well visualized    3 V Trachea View:       Not well visualized  Aortic Arch:           Appears normal         IVC:                    Appears normal  Ductal Arch:           Appears normal         Crossing:               Not well visualized  SVC:                   Appears normal  Abdomen  Ventral Wall:          Appears normal         Lt Kidney:              Appears normal  Cord Insertion:        Appears normal         Rt Kidney:              Appears normal  Situs:                 Appears normal         Bladder:                Appears normal  Stomach:               Appears normal  Extremities  Lt Humerus:            Appears normal         Lt Femur:               Appears normal  Rt Humerus:            Appears normal         Rt Femur:               Appears normal  Lt Forearm:            Appears normal         Lt Lower Leg:           Appears normal  Rt Forearm:            Appears normal         Rt Lower Leg:           Appears normal  Lt Hand:               Open hand nml          Lt Foot:                Nml foot  Rt Hand:               Open hand nml          Rt Foot:                Nml foot  Other  Umbilical Cord:  Normal 3-vessel        Genitalia:              Female-nml ---------------------------------------------------------------------- Cervix Uterus Adnexa  Cervix  Length:            3.6  cm.  Normal appearance by transvaginal scan  Uterus  Single fibroid noted, see table below.  Right Ovary  Size(cm)     2.18   x   2.17   x  1.18      Vol(ml): 2.92  Within normal limits.  Left Ovary  Size(cm)        2   x   2.54    x  1.08      Vol(ml): 2.87  Within normal limits.  Cul De Sac  No free fluid seen.  Adnexa  No adnexal mass visualized ---------------------------------------------------------------------- Myomas  Site                     L(cm)      W(cm)      D(cm)       Location  Ant Lt                   4.25       3.25       4 ----------------------------------------------------------------------  Blood Flow                  RI       PI       Comments ---------------------------------------------------------------------- Impression  G4 P2103 at 19w 1d gestation.  Patient is here for fetal  anatomy scan.  Her high-risk pregnancy problems include:  -History of preterm delivery at [redacted] weeks gestation.  - Previous cesarean delivery.  - Advanced maternal age (42 years).  On cell-free fetal DNA  screening, the risks of fetal aneuploidies are not increased.  -Silent carrier for alpha thalassemia.  Patient met with our  genetic counselor today after ultrasound.  Obstetrical history is significant for a term vaginal delivery  (2004) followed by a term cesarean delivery (2013).  2016, at  [redacted] weeks gestation, patient had lower abdominal pressure  and on evaluation, the cervix was dilated.  She had ruptured  membranes and had a vaginal delivery of a female infant  weighing 1 pounds and 7 ounces at birth.  [facility].  Ultrasound  We performed fetal anatomical survey.  Amniotic fluid is  normal good fetal activity seen.  No markers of aneuploidies  or obvious fetal structural defects is.  Fetal biometry is  consistent with the previously established dates.  Because of a history of preterm delivery at [redacted] weeks  gestation, we performed a transvaginal ultrasound to  evaluate the cervix.  The cervix measures 3.6 cm, which is  normal.  No shortening or funneling was seen on transfundal  pressure.  As maternal obesity was a limitation on the resolution of  images, fetal anomalies may be missed.  Or concerns include   History of preterm delivery  Patient reports she did not have any cervical surgeries after  her second childbirth.  Patientfever or rashes before preterm  delivery.  I counseled the patient that history of preterm delivery  increases the risk of preterm delivery and subsequent  pregnancies.  It is difficult to diagnose cervical incompetence  from her history.  Other causes include subclinical infection  leading amniotic membrane rupture.  I recommended cervical length measurement in 2 or  4 weeks  to rule out cervical insufficiency.  I informed her that if  significant cervical shortening is seen, we will recommend  cerclage.  Advanced maternal age  Advanced maternal age is associated with increased risk of  fetal chromosomal anomalies.  I discussed the significance of  limitations of cell free fetal DNA screening that has a greater  detection rate for fetal Down syndrome.  AMA after 40 years is associated with a small increased risk  of stillbirth.  We recommend weekly antenatal testing from [redacted]  weeks gestation until delivery.  Previous cesarean delivery  Patient had low transverse cesarean section in the second  pregnancy.  I counseled the patient that repeat cesarean  deliveries increase the risk of placenta previa and/or placenta  accreta spectrum.  I briefly discussed the benefit of VBAC  that is associated with less than 1% of scar dehiscence.  Vaginal delivery reduces the complication of hemorrhage,  infection is normal, which are associated with cesarean  delivery.  Patient is keen on VBAC.  After genetic counseling, patient opted not to have partner  screening. ---------------------------------------------------------------------- Recommendations  - An appointment was made for birth control 2 weeks and 4  weeks for cervical length measurements.  -Completion of fetal anatomy in 4 weeks.  - Fetal growth assessment at 32 weeks and [redacted] weeks  gestation.  - Weekly antenatal testing at [redacted] weeks gestation till  delivery. ----------------------------------------------------------------------                 Cassandria Clever, MD Electronically Signed Final Report   03/03/2024 03:02 pm ----------------------------------------------------------------------    MAU Course: Ultrasound done, results reassuring to patient. Pain noted to resolve while in MAU. Reassuring results  Assessment: 1. Uterine fibroids affecting pregnancy in second trimester   2. Degeneration of uterine fibroid   3. [redacted] weeks gestation of pregnancy     Plan: No evidence of PTL, patient reassured. PTL precautions reviewed. Likely had fibroid pain, prescribed medication to help with this.  Discussed fibroid degeneration pain in pregnancy  Discharged homed Preterm labor precautions and fetal kick counts reviewed Follow up with OB provider as scheduled.    Allergies as of 03/17/2024   No Known Allergies      Medication List     TAKE these medications    acetaminophen  500 MG tablet Commonly known as: TYLENOL  Take 2 tablets (1,000 mg total) by mouth every 6 (six) hours as needed for moderate pain (pain score 4-6), mild pain (pain score 1-3) or headache.   EQ Aspirin Adult Low Dose 81 MG tablet Generic drug: aspirin EC Take 81 mg by mouth daily.   ibuprofen  600 MG tablet Commonly known as: ADVIL  Take 1 tablet (600 mg total) by mouth 3 (three) times daily with meals as needed for moderate pain (pain score 4-6) or cramping. Only take until you are [redacted] weeks pregnant   ondansetron  4 MG tablet Commonly known as: ZOFRAN  Take 1 tablet 3 times a day by oral route as needed, for pregnancy related nausea.   prenatal multivitamin Tabs tablet Take 1 tablet by mouth every morning.        Julianne Octave, MD 03/17/2024 11:44 PM

## 2024-03-18 ENCOUNTER — Encounter: Payer: Self-pay | Admitting: Family Medicine

## 2024-03-18 ENCOUNTER — Other Ambulatory Visit: Payer: Self-pay

## 2024-03-18 ENCOUNTER — Ambulatory Visit: Admitting: Family Medicine

## 2024-03-18 VITALS — BP 119/72 | HR 80 | Wt 185.4 lb

## 2024-03-18 DIAGNOSIS — O0992 Supervision of high risk pregnancy, unspecified, second trimester: Secondary | ICD-10-CM | POA: Diagnosis not present

## 2024-03-18 DIAGNOSIS — Z8279 Family history of other congenital malformations, deformations and chromosomal abnormalities: Secondary | ICD-10-CM

## 2024-03-18 DIAGNOSIS — Z3A21 21 weeks gestation of pregnancy: Secondary | ICD-10-CM

## 2024-03-18 DIAGNOSIS — Z8751 Personal history of pre-term labor: Secondary | ICD-10-CM

## 2024-03-18 DIAGNOSIS — O09522 Supervision of elderly multigravida, second trimester: Secondary | ICD-10-CM

## 2024-03-18 DIAGNOSIS — O0990 Supervision of high risk pregnancy, unspecified, unspecified trimester: Secondary | ICD-10-CM

## 2024-03-18 DIAGNOSIS — O34219 Maternal care for unspecified type scar from previous cesarean delivery: Secondary | ICD-10-CM

## 2024-03-18 DIAGNOSIS — D563 Thalassemia minor: Secondary | ICD-10-CM

## 2024-03-18 LAB — GC/CHLAMYDIA PROBE AMP (~~LOC~~) NOT AT ARMC
Chlamydia: NEGATIVE
Comment: NEGATIVE
Comment: NORMAL
Neisseria Gonorrhea: NEGATIVE

## 2024-03-18 NOTE — Progress Notes (Signed)
   PRENATAL VISIT NOTE  Subjective:  Brenda Richards is a 43 y.o. [phone] at [redacted]w[redacted]d being seen today for ongoing prenatal care.  She is currently monitored for the following issues for this high-risk pregnancy and has GERD (gastroesophageal reflux disease); History of cervical incompetence; History of preterm delivery/PPROM at 23 weeks; Supervision of high risk pregnancy, antepartum; AMA (advanced maternal age) multigravida 35+; History of cesarean section followed by successful vaginal birth (VBAC); Unwanted fertility; Alpha thalassemia silent carrier; Obesity affecting pregnancy, antepartum; and History of other child with congenital heart defect on their problem list.  Patient reports no complaints.  Contractions: Not present. Vag. Bleeding: None.  Movement: Present. Denies leaking of fluid.   The following portions of the patient's history were reviewed and updated as appropriate: allergies, current medications, past family history, past medical history, past social history, past surgical history and problem list.   Objective:   Vitals:   03/18/24 0824  BP: 119/72  Pulse: 80  Weight: 185 lb 6.4 oz (84.1 kg)    Fetal Status: Fetal Heart Rate (bpm): 157   Movement: Present     General:  Alert, oriented and cooperative. Patient is in no acute distress.  Skin: Skin is warm and dry. No rash noted.   Cardiovascular: Normal heart rate noted  Respiratory: Normal respiratory effort, no problems with respiration noted  Abdomen: Soft, gravid, appropriate for gestational age.  Pain/Pressure: Absent     Pelvic: Cervical exam deferred        Extremities: Normal range of motion.  Edema: None  Mental Status: Normal mood and affect. Normal behavior. Normal judgment and thought content.   Assessment and Plan:  Pregnancy: [id number] at [redacted]w[redacted]d 1. [redacted] weeks gestation of pregnancy (Primary)   2. Supervision of high risk pregnancy, antepartum Continue prenatal care.   3. History of preterm  delivery/PPROM at 23 weeks Cervical length 4.5 cm yesterday at MAU  4. History of other child with congenital heart defect Consider fetal echo  5. History of cesarean section followed by successful vaginal birth (VBAC) Desires TOLAC  6. Multigravida of advanced maternal age in second trimester LR NIPT  7. Alpha thalassemia silent carrier Declines partner testing.  Preterm labor symptoms and general obstetric precautions including but not limited to vaginal bleeding, contractions, leaking of fluid and fetal movement were reviewed in detail with the patient. Please refer to After Visit Summary for other counseling recommendations.   Return in 4 weeks (on 04/15/2024).  Future Appointments  Date Time Provider Department Center  03/22/2024  7:00 AM [facility] PROVIDER 1 [facility]  03/22/2024  7:30 AM [facility] US5 [facility]  04/05/2024  8:00 AM [facility] PROVIDER 1 [facility]  04/05/2024  8:30 AM [facility] US3 [facility]  04/16/2024  8:15 AM Ilona Malta, Lorinda Root, MD [facility]    Granville Layer, MD

## 2024-03-19 LAB — CULTURE, OB URINE

## 2024-03-22 ENCOUNTER — Other Ambulatory Visit: Payer: Self-pay | Admitting: Obstetrics and Gynecology

## 2024-03-22 ENCOUNTER — Other Ambulatory Visit

## 2024-03-22 ENCOUNTER — Ambulatory Visit (HOSPITAL_BASED_OUTPATIENT_CLINIC_OR_DEPARTMENT_OTHER): Admitting: Obstetrics

## 2024-03-22 ENCOUNTER — Ambulatory Visit

## 2024-03-22 ENCOUNTER — Ambulatory Visit: Attending: Obstetrics and Gynecology

## 2024-03-22 VITALS — BP 129/66 | HR 79

## 2024-03-22 DIAGNOSIS — O99210 Obesity complicating pregnancy, unspecified trimester: Secondary | ICD-10-CM

## 2024-03-22 DIAGNOSIS — Z8742 Personal history of other diseases of the female genital tract: Secondary | ICD-10-CM | POA: Diagnosis present

## 2024-03-22 DIAGNOSIS — O0990 Supervision of high risk pregnancy, unspecified, unspecified trimester: Secondary | ICD-10-CM

## 2024-03-22 DIAGNOSIS — O09522 Supervision of elderly multigravida, second trimester: Secondary | ICD-10-CM

## 2024-03-22 DIAGNOSIS — Z8759 Personal history of other complications of pregnancy, childbirth and the puerperium: Secondary | ICD-10-CM | POA: Insufficient documentation

## 2024-03-22 DIAGNOSIS — O99012 Anemia complicating pregnancy, second trimester: Secondary | ICD-10-CM | POA: Diagnosis not present

## 2024-03-22 DIAGNOSIS — D259 Leiomyoma of uterus, unspecified: Secondary | ICD-10-CM | POA: Diagnosis not present

## 2024-03-22 DIAGNOSIS — O3410 Maternal care for benign tumor of corpus uteri, unspecified trimester: Secondary | ICD-10-CM | POA: Diagnosis not present

## 2024-03-22 DIAGNOSIS — Z8751 Personal history of pre-term labor: Secondary | ICD-10-CM | POA: Diagnosis present

## 2024-03-22 DIAGNOSIS — E669 Obesity, unspecified: Secondary | ICD-10-CM

## 2024-03-22 DIAGNOSIS — O99212 Obesity complicating pregnancy, second trimester: Secondary | ICD-10-CM | POA: Diagnosis present

## 2024-03-22 DIAGNOSIS — O09212 Supervision of pregnancy with history of pre-term labor, second trimester: Secondary | ICD-10-CM

## 2024-03-22 DIAGNOSIS — D563 Thalassemia minor: Secondary | ICD-10-CM | POA: Insufficient documentation

## 2024-03-22 DIAGNOSIS — O3412 Maternal care for benign tumor of corpus uteri, second trimester: Secondary | ICD-10-CM

## 2024-03-22 DIAGNOSIS — Z3A21 21 weeks gestation of pregnancy: Secondary | ICD-10-CM | POA: Diagnosis present

## 2024-03-22 NOTE — Progress Notes (Addendum)
 MFM Consult Note  Brenda Richards is currently at 21 weeks and 6 days.  She has been followed due to advanced maternal age (43 years old), fibroid uterus, and maternal obesity.  Her last pregnancy resulted in a preterm delivery at 23+ weeks following PPROM.  A limited ultrasound performed today shows that the fetus is in the vertex presentation.    There was normal amniotic fluid noted.   On today's exam, an intracardiac echogenic focus was noted in the left ventricle of the fetal heart.    The small association between an echogenic focus and Down syndrome was discussed.   Due to the echogenic focus noted today, the patient was offered and declined an amniocentesis today for definitive diagnosis of fetal aneuploidy.  She reports that she is comfortable with her negative cell free DNA test.  A transvaginal ultrasound performed today due to her prior history of a 23+ week delivery showed a normal cervical length of 3.96 cm long without any signs of funneling.    The patient was reassured that her risk of a preterm birth is low at this time based on the normal cervical length observed today.    She will return in 2 weeks for a growth scan and to complete the views of the fetal anatomy.    Due to advanced maternal age, we will continue to follow her with growth ultrasounds throughout her pregnancy.    Weekly fetal testing should be started at around 34 weeks.    The patient stated that all of her questions were answered today.  A total of 20 minutes was spent counseling and coordinating the care for this patient.  Greater than 50% of the time was spent in direct face-to-face contact.

## 2024-03-30 ENCOUNTER — Ambulatory Visit: Payer: Self-pay

## 2024-04-05 ENCOUNTER — Ambulatory Visit: Attending: Obstetrics and Gynecology

## 2024-04-05 ENCOUNTER — Ambulatory Visit: Admitting: Obstetrics

## 2024-04-05 ENCOUNTER — Other Ambulatory Visit: Payer: Self-pay | Admitting: *Deleted

## 2024-04-05 VITALS — BP 118/73 | HR 78

## 2024-04-05 DIAGNOSIS — D563 Thalassemia minor: Secondary | ICD-10-CM | POA: Diagnosis present

## 2024-04-05 DIAGNOSIS — O09292 Supervision of pregnancy with other poor reproductive or obstetric history, second trimester: Secondary | ICD-10-CM | POA: Diagnosis present

## 2024-04-05 DIAGNOSIS — O3412 Maternal care for benign tumor of corpus uteri, second trimester: Secondary | ICD-10-CM

## 2024-04-05 DIAGNOSIS — O26892 Other specified pregnancy related conditions, second trimester: Secondary | ICD-10-CM

## 2024-04-05 DIAGNOSIS — O09212 Supervision of pregnancy with history of pre-term labor, second trimester: Secondary | ICD-10-CM

## 2024-04-05 DIAGNOSIS — O99210 Obesity complicating pregnancy, unspecified trimester: Secondary | ICD-10-CM | POA: Diagnosis present

## 2024-04-05 DIAGNOSIS — O09522 Supervision of elderly multigravida, second trimester: Secondary | ICD-10-CM | POA: Diagnosis present

## 2024-04-05 DIAGNOSIS — Z3A23 23 weeks gestation of pregnancy: Secondary | ICD-10-CM | POA: Insufficient documentation

## 2024-04-05 DIAGNOSIS — E669 Obesity, unspecified: Secondary | ICD-10-CM

## 2024-04-05 DIAGNOSIS — Z8751 Personal history of pre-term labor: Secondary | ICD-10-CM | POA: Insufficient documentation

## 2024-04-05 DIAGNOSIS — O0990 Supervision of high risk pregnancy, unspecified, unspecified trimester: Secondary | ICD-10-CM

## 2024-04-05 DIAGNOSIS — R109 Unspecified abdominal pain: Secondary | ICD-10-CM

## 2024-04-05 DIAGNOSIS — O99212 Obesity complicating pregnancy, second trimester: Secondary | ICD-10-CM

## 2024-04-05 DIAGNOSIS — Z8759 Personal history of other complications of pregnancy, childbirth and the puerperium: Secondary | ICD-10-CM | POA: Diagnosis present

## 2024-04-05 NOTE — Progress Notes (Signed)
 MFM Consult Note  Brenda Richards is currently at 23 weeks and 6 days.  Brenda Richards has been followed due to advanced maternal age (43 years old), fibroid uterus, and maternal obesity.  Her last pregnancy resulted in a preterm delivery at 23+ weeks following PPROM.  Brenda Richards denies any problems since her last exam.  On today's exam, the overall EFW of 1 pound 6 ounces measures at the 32nd percentile for her gestational age.  There was normal amniotic fluid noted.   An intracardiac echogenic focus continues to be noted in the left ventricle of the fetal heart.  Brenda Richards has declined an amniocentesis for definitive diagnosis of Down syndrome.  Brenda Richards is comfortable with the low risk for Down syndrome indicated by her cell free DNA test.  A transvaginal ultrasound performed today due to her prior history of a 23+ week delivery showed a normal cervical length of 4.3 cm long without any signs of funneling.    The patient was reassured that her risk of a preterm birth is low at this time based on the normal cervical length observed today.    Due to advanced maternal age, we will continue to follow her with growth ultrasounds throughout her pregnancy.    Weekly fetal testing should be started at around 34 weeks.    Brenda Richards will return in 4 weeks for another growth scan.  The patient stated that all of her questions were answered today.  A total of 20 minutes was spent counseling and coordinating the care for this patient.  Greater than 50% of the time was spent in direct face-to-face contact.

## 2024-04-16 ENCOUNTER — Other Ambulatory Visit: Payer: Self-pay

## 2024-04-16 ENCOUNTER — Ambulatory Visit: Admitting: Family Medicine

## 2024-04-16 ENCOUNTER — Other Ambulatory Visit

## 2024-04-16 VITALS — BP 109/70 | HR 80 | Wt 184.9 lb

## 2024-04-16 DIAGNOSIS — O09522 Supervision of elderly multigravida, second trimester: Secondary | ICD-10-CM

## 2024-04-16 DIAGNOSIS — O99210 Obesity complicating pregnancy, unspecified trimester: Secondary | ICD-10-CM

## 2024-04-16 DIAGNOSIS — Z3A25 25 weeks gestation of pregnancy: Secondary | ICD-10-CM

## 2024-04-16 DIAGNOSIS — Z3009 Encounter for other general counseling and advice on contraception: Secondary | ICD-10-CM

## 2024-04-16 DIAGNOSIS — O34219 Maternal care for unspecified type scar from previous cesarean delivery: Secondary | ICD-10-CM

## 2024-04-16 DIAGNOSIS — O99212 Obesity complicating pregnancy, second trimester: Secondary | ICD-10-CM | POA: Diagnosis not present

## 2024-04-16 DIAGNOSIS — Z8751 Personal history of pre-term labor: Secondary | ICD-10-CM

## 2024-04-16 DIAGNOSIS — O0990 Supervision of high risk pregnancy, unspecified, unspecified trimester: Secondary | ICD-10-CM

## 2024-04-16 DIAGNOSIS — Z8279 Family history of other congenital malformations, deformations and chromosomal abnormalities: Secondary | ICD-10-CM

## 2024-04-16 DIAGNOSIS — O09212 Supervision of pregnancy with history of pre-term labor, second trimester: Secondary | ICD-10-CM | POA: Diagnosis not present

## 2024-04-16 DIAGNOSIS — E669 Obesity, unspecified: Secondary | ICD-10-CM

## 2024-04-16 NOTE — Progress Notes (Signed)
BTL signed today

## 2024-04-16 NOTE — Progress Notes (Signed)
   Subjective:  Brenda Richards is a 43 y.o. [phone] at [redacted]w[redacted]d being seen today for ongoing prenatal care.  She is currently monitored for the following issues for this high-risk pregnancy and has GERD (gastroesophageal reflux disease); History of cervical incompetence; History of preterm delivery/PPROM at 23 weeks; Supervision of high risk pregnancy, antepartum; AMA (advanced maternal age) multigravida 35+; History of cesarean section followed by successful vaginal birth (VBAC); Unwanted fertility; Alpha thalassemia silent carrier; Obesity affecting pregnancy, antepartum; and History of other child with congenital heart defect on their problem list.  Patient reports some upper abdominal pain.  Contractions: Irritability. Vag. Bleeding: None.  Movement: Present. Denies leaking of fluid.   The following portions of the patient's history were reviewed and updated as appropriate: allergies, current medications, past family history, past medical history, past social history, past surgical history and problem list. Problem list updated.  Objective:   Vitals:   04/16/24 0820  BP: 109/70  Pulse: 80  Weight: 184 lb 14.4 oz (83.9 kg)    Fetal Status: Fetal Heart Rate (bpm): 143   Movement: Present     General:  Alert, oriented and cooperative. Patient is in no acute distress.  Skin: Skin is warm and dry. No rash noted.   Cardiovascular: Normal heart rate noted  Respiratory: Normal respiratory effort, no problems with respiration noted  Abdomen: Soft, gravid, appropriate for gestational age. Pain/Pressure: Present     Pelvic: Vag. Bleeding: None     Cervical exam deferred        Extremities: Normal range of motion.  Edema: Trace  Mental Status: Normal mood and affect. Normal behavior. Normal judgment and thought content.   Urinalysis:      Assessment and Plan:  Pregnancy: [id number] at [redacted]w[redacted]d  1. Supervision of high risk pregnancy, antepartum (Primary) BP and FHR normal Thought she was  doing gtt today so came fasting--since already 25 weeks can do GTT, will do third trimester labs today Having some upper abdominal pain intermittently, on questioning reports it does tend to happen after eating, lasts about a half hour. On review of chart, has US  from Jan 2022 that showed cholelithiasis. Suspect this is etiology, asked her to keep food/symptom diary to see if its that and also given gallbladder diet handout  2. History of cesarean section followed by successful vaginal birth (VBAC) Sign TOLAC consent at next visit  3. History of other child with congenital heart defect Granted permission to look in sons chart, appears he had a patent PDA but no other cardiac defects, fetal echo not indicated  4. History of preterm delivery/PPROM at 23 weeks Normal cervical length at last US  on 04/05/2024  5. Obesity affecting pregnancy, antepartum, unspecified obesity type   6. Unwanted fertility BTL consent signed today (has private insurance, but just in case)  7. Multigravida of advanced maternal age in second trimester On ASA Antenatal testing per MFM guidelines   Preterm labor symptoms and general obstetric precautions including but not limited to vaginal bleeding, contractions, leaking of fluid and fetal movement were reviewed in detail with the patient. Please refer to After Visit Summary for other counseling recommendations.  Return in 2 weeks (on 04/30/2024) for [facility], ob visit, 28 wk labs.   Teena Feast, MD

## 2024-04-16 NOTE — Patient Instructions (Signed)
 Opciones de mtodos anticonceptivos Birth Control Options Los mtodos anticonceptivos tambin se denominan anticonceptivos. Los anticonceptivos previenen Firefighter. Hay muchos tipos de anticonceptivos. Trabaje con el mdico para encontrar la opcin ms adecuada para usted. Anticonceptivos que [place] hormonas Estos tipos de anticonceptivos contienen hormonas para Neurosurgeon. Implante anticonceptivo Este es un pequeo tubo que se coloca dentro de la piel del brazo. El tubo Insurance claims handler colocado durante 3 aos como mximo. Inyeccin anticonceptiva Son inyecciones que se aplican cada 3 meses. Pldoras anticonceptivas Esta es una pldora que se toma todos [place]. Debe tomarla a la [employer]. Parche anticonceptivo Este es un parche que se coloca sobre la piel. Se debe cambiar 1 vez por semana durante 3 semanas. Despus de [employer], el parche se debe retirar durante 1 semana. Anillo vaginal  Este es un anillo de plstico blando que se coloca dentro de la vagina. El anillo se deja colocado durante 3 semanas. Luego, se debe retirar durante 1 semana. Despus se coloca un nuevo anillo. Mtodos de barrera  Preservativo masculino Es una cubierta delgada que se coloca sobre el pene antes de [place]. El preservativo se desecha despus de Doctor, hospital. Preservativo femenino Es una cubierta blanda y suelta que se coloca en la vagina antes de [place]. El preservativo se desecha despus de Doctor, hospital. Diafragma El diafragma es una barrera blanda con forma de tazn. Debe estar hecho para adaptarse a su cuerpo. Se coloca en la vagina antes de tener sexo con una sustancia qumica que destruye los espermatozoides llamada espermicida. El Designer, fashion/clothing en la vagina durante 6 a 8 horas despus de tener sexo y debe retirarse en un plazo de 24 horas. El diafragma se debe reemplazar: Cada 1 o 2 aos. Despus de dar a luz. Despus de aumentar ms de 15 libras (6.8  kg). Si se somete a una ciruga en la pelvis. Capuchn cervical Este es un capuchn pequeo y [place] se fija sobre el cuello uterino. El cuello uterino es la parte ms baja del tero. Se coloca en la vagina antes del sexo, junto con un espermicida. El capuchn debe fabricarse para usted. El capuchn se debe dejar colocado durante 6 a 8 horas despus del sexo. Se debe retirar en un plazo de 48 horas. El capuchn cervical debe ser recetado y adaptado a su cuerpo por un mdico. Debe reemplazarse cada 2 aos. Esponja Esta es una esponja pequea que se coloca en la vagina antes de [place]. Se debe dejar colocada durante al menos 6 horas despus de [employer]. Se debe retirar en un plazo de 30 horas y desecharse. Espermicidas Son sustancias qumicas que destruyen o impiden que los espermatozoides ingresen al tero. Se pueden presentar en forma de pldora, crema, gel o espuma que se debe colocar en la vagina. Se deben usar al menos de 10 a 15 minutos antes de [employer]. Dispositivo intrauterino Un dispositivo intrauterino (DIU) es un dispositivo que un mdico coloca dentro del tero. Existen dos tipos: DIU hormonal. Este tipo puede permanecer colocado durante 3 a 5 aos. DIU de cobre. Este tipo Insurance claims handler colocado durante 10 aos. Mtodos anticonceptivos permanentes Ligadura de trompas en la mujer Es una ciruga para obstruir las trompas de [place]. Esterilizacin masculina Es una ciruga, llamada vasectoma, para ligar los conductos que transportan los espermatozoides en los hombres. Este mtodo funciona al cabo de 3 meses. Se deben usar otros mtodos anticonceptivos durante 3 meses. Mtodos de planificacin  natural Esto significa no tener [employer] la pareja femenina podra quedar embarazada. A continuacin se mencionan algunos mtodos anticonceptivos por planificacin natural: Usar un calendario a fin de: Hacer un seguimiento de la duracin de cada ciclo  menstrual. Determinar en [employer] se podra producir Firefighter. Planificar no tener [employer] en que se podra producir Firefighter. Reconocer los signos de la ovulacin y no tener relaciones sexuales durante ese perodo. La pareja femenina puede detectar cundo ser la ovulacin haciendo un seguimiento de su temperatura todos [place]. Tambin puede examinar si hay cambios en la mucosidad que proviene del cuello uterino. Dnde obtener ms informacin Centers for Disease Control and Prevention (Centros para el Control y la Prevencin de Enfermedades): [email or web address]. Luego: Introduzca "birth control" o "anticonceptivos" en el cuadro de bsqueda. Esta informacin no tiene Theme park manager el consejo del mdico. Asegrese de hacerle al mdico cualquier pregunta que tenga. Document Revised: 07/11/2023 Document Reviewed: 07/11/2023 Elsevier Patient Education  2024 [employer].

## 2024-04-17 LAB — CBC
Hematocrit: 38.7 % (ref 34.0–46.6)
Hemoglobin: 12.1 g/dL (ref 11.1–15.9)
MCH: 27.3 pg (ref 26.6–33.0)
MCHC: 31.3 g/dL — ABNORMAL LOW (ref 31.5–35.7)
MCV: 87 fL (ref 79–97)
Platelets: 277 10*3/uL (ref 150–450)
RBC: 4.43 x10E6/uL (ref 3.77–5.28)
RDW: 13.6 % (ref 11.7–15.4)
WBC: 9.5 10*3/uL (ref 3.4–10.8)

## 2024-04-17 LAB — GLUCOSE TOLERANCE, 2 HOURS W/ 1HR
Glucose, 1 hour: 169 mg/dL (ref 70–179)
Glucose, 2 hour: 146 mg/dL (ref 70–152)
Glucose, Fasting: 79 mg/dL (ref 70–91)

## 2024-04-17 LAB — HIV ANTIBODY (ROUTINE TESTING W REFLEX): HIV Screen 4th Generation wRfx: NONREACTIVE

## 2024-04-17 LAB — RPR: RPR Ser Ql: NONREACTIVE

## 2024-04-18 ENCOUNTER — Ambulatory Visit: Payer: Self-pay | Admitting: Family Medicine

## 2024-04-18 DIAGNOSIS — O0990 Supervision of high risk pregnancy, unspecified, unspecified trimester: Secondary | ICD-10-CM

## 2024-04-19 ENCOUNTER — Encounter: Payer: Self-pay | Admitting: *Deleted

## 2024-05-03 ENCOUNTER — Ambulatory Visit: Admitting: Family Medicine

## 2024-05-03 ENCOUNTER — Other Ambulatory Visit: Payer: Self-pay

## 2024-05-03 VITALS — BP 109/76 | HR 98 | Wt 190.5 lb

## 2024-05-03 DIAGNOSIS — O34219 Maternal care for unspecified type scar from previous cesarean delivery: Secondary | ICD-10-CM

## 2024-05-03 DIAGNOSIS — Z8279 Family history of other congenital malformations, deformations and chromosomal abnormalities: Secondary | ICD-10-CM

## 2024-05-03 DIAGNOSIS — O09522 Supervision of elderly multigravida, second trimester: Secondary | ICD-10-CM

## 2024-05-03 DIAGNOSIS — O0990 Supervision of high risk pregnancy, unspecified, unspecified trimester: Secondary | ICD-10-CM | POA: Diagnosis not present

## 2024-05-03 DIAGNOSIS — O99210 Obesity complicating pregnancy, unspecified trimester: Secondary | ICD-10-CM

## 2024-05-03 DIAGNOSIS — Z3009 Encounter for other general counseling and advice on contraception: Secondary | ICD-10-CM

## 2024-05-03 DIAGNOSIS — Z8751 Personal history of pre-term labor: Secondary | ICD-10-CM | POA: Diagnosis not present

## 2024-05-03 DIAGNOSIS — Z3A27 27 weeks gestation of pregnancy: Secondary | ICD-10-CM

## 2024-05-04 NOTE — Progress Notes (Addendum)
   PRENATAL VISIT NOTE  Subjective:  Brenda Richards is a 43 y.o. [phone] at [redacted]w[redacted]d being seen today for ongoing prenatal care.  She is currently monitored for the following issues for this high-risk pregnancy and has GERD (gastroesophageal reflux disease); History of cervical incompetence; History of preterm delivery/PPROM at 23 weeks; Supervision of high risk pregnancy, antepartum; AMA (advanced maternal age) multigravida 35+; History of cesarean section followed by successful vaginal birth (VBAC); Unwanted fertility; Alpha thalassemia silent carrier; Obesity affecting pregnancy, antepartum; and History of other child with congenital heart defect on their problem list.  Patient reports no bleeding, no contractions, no cramping, and no leaking.  Contractions: Not present. Vag. Bleeding: None.  Movement: (!) Decreased. Denies leaking of fluid.   The following portions of the patient's history were reviewed and updated as appropriate: allergies, current medications, past family history, past medical history, past social history, past surgical history and problem list.   Objective:    Vitals:   05/03/24 1346  BP: 109/76  Pulse: 98  Weight: 190 lb 8 oz (86.4 kg)    Fetal Status:  Fetal Heart Rate (bpm): 146   Movement: (!) Decreased    General: Alert, oriented and cooperative. Patient is in no acute distress.  Skin: Skin is warm and dry. No rash noted.   Cardiovascular: Normal heart rate noted  Respiratory: Normal respiratory effort, no problems with respiration noted  Abdomen: Soft, gravid, appropriate for gestational age.  Pain/Pressure: Present     Pelvic: Cervical exam deferred        Extremities: Normal range of motion.  Edema: None  Mental Status: Normal mood and affect. Normal behavior. Normal judgment and thought content.   Assessment and Plan:  Pregnancy: G4P2103 at [redacted]w[redacted]d 1. Supervision of high risk pregnancy, antepartum (Primary) FHR BP appropriate today Discussed  decreased fetal movement and patient reports she does not feel the movement but on palpation can feel multiple movements.  When she puts her hand on her stomach she can feel the movements now.  Discussed kick counts.  2. History of cesarean section followed by successful vaginal birth (VBAC) Desires trial of labor Consent signed today  3. History of other child with congenital heart defect Per chart review previous child had patent PDA.  Fetal echo not indicated  4. History of preterm delivery/PPROM at 23 weeks No signs of labor today  5. Obesity affecting pregnancy, antepartum, unspecified obesity type   6. Unwanted fertility BTL signed on 5/30 visit  7. Multigravida of advanced maternal age in second trimester On ASA Antenatal testing per MFM  8. [redacted] weeks gestation of pregnancy   Preterm labor symptoms and general obstetric precautions including but not limited to vaginal bleeding, contractions, leaking of fluid and fetal movement were reviewed in detail with the patient. Please refer to After Visit Summary for other counseling recommendations.   No follow-ups on file.  Future Appointments  Date Time Provider Department Center  05/07/2024  9:00 AM [facility] PROVIDER 1 [facility]  05/07/2024  9:30 AM [facility] US5 [facility]  05/18/2024  8:15 AM Tresia Fruit, MD [facility]    Ferdie Housekeeper, MD

## 2024-05-07 ENCOUNTER — Ambulatory Visit: Attending: Obstetrics | Admitting: Obstetrics and Gynecology

## 2024-05-07 ENCOUNTER — Other Ambulatory Visit: Payer: Self-pay | Admitting: *Deleted

## 2024-05-07 ENCOUNTER — Ambulatory Visit

## 2024-05-07 DIAGNOSIS — O09523 Supervision of elderly multigravida, third trimester: Secondary | ICD-10-CM | POA: Insufficient documentation

## 2024-05-07 DIAGNOSIS — O0990 Supervision of high risk pregnancy, unspecified, unspecified trimester: Secondary | ICD-10-CM | POA: Diagnosis not present

## 2024-05-07 DIAGNOSIS — O09213 Supervision of pregnancy with history of pre-term labor, third trimester: Secondary | ICD-10-CM | POA: Insufficient documentation

## 2024-05-07 DIAGNOSIS — O09212 Supervision of pregnancy with history of pre-term labor, second trimester: Secondary | ICD-10-CM | POA: Diagnosis not present

## 2024-05-07 DIAGNOSIS — O09522 Supervision of elderly multigravida, second trimester: Secondary | ICD-10-CM

## 2024-05-07 DIAGNOSIS — O3413 Maternal care for benign tumor of corpus uteri, third trimester: Secondary | ICD-10-CM | POA: Insufficient documentation

## 2024-05-07 DIAGNOSIS — O3412 Maternal care for benign tumor of corpus uteri, second trimester: Secondary | ICD-10-CM

## 2024-05-07 DIAGNOSIS — Z3A28 28 weeks gestation of pregnancy: Secondary | ICD-10-CM

## 2024-05-07 DIAGNOSIS — Z8751 Personal history of pre-term labor: Secondary | ICD-10-CM

## 2024-05-07 DIAGNOSIS — Z148 Genetic carrier of other disease: Secondary | ICD-10-CM | POA: Insufficient documentation

## 2024-05-07 DIAGNOSIS — O99012 Anemia complicating pregnancy, second trimester: Secondary | ICD-10-CM

## 2024-05-07 DIAGNOSIS — O99213 Obesity complicating pregnancy, third trimester: Secondary | ICD-10-CM | POA: Diagnosis not present

## 2024-05-07 DIAGNOSIS — D563 Thalassemia minor: Secondary | ICD-10-CM

## 2024-05-07 DIAGNOSIS — D259 Leiomyoma of uterus, unspecified: Secondary | ICD-10-CM

## 2024-05-07 NOTE — Progress Notes (Signed)
 After review, MFM consult with provider is not indicated for today  Brenda Clever, MD 05/07/2024 1:16 PM  [facility]

## 2024-05-18 ENCOUNTER — Ambulatory Visit: Admitting: Obstetrics & Gynecology

## 2024-05-18 ENCOUNTER — Other Ambulatory Visit: Payer: Self-pay

## 2024-05-18 VITALS — BP 110/73 | HR 80 | Wt 188.4 lb

## 2024-05-18 DIAGNOSIS — O34219 Maternal care for unspecified type scar from previous cesarean delivery: Secondary | ICD-10-CM | POA: Diagnosis not present

## 2024-05-18 DIAGNOSIS — O09523 Supervision of elderly multigravida, third trimester: Secondary | ICD-10-CM

## 2024-05-18 DIAGNOSIS — O99213 Obesity complicating pregnancy, third trimester: Secondary | ICD-10-CM

## 2024-05-18 DIAGNOSIS — Z3A30 30 weeks gestation of pregnancy: Secondary | ICD-10-CM

## 2024-05-18 DIAGNOSIS — O0990 Supervision of high risk pregnancy, unspecified, unspecified trimester: Secondary | ICD-10-CM

## 2024-05-18 DIAGNOSIS — O09213 Supervision of pregnancy with history of pre-term labor, third trimester: Secondary | ICD-10-CM

## 2024-05-18 DIAGNOSIS — Z3009 Encounter for other general counseling and advice on contraception: Secondary | ICD-10-CM

## 2024-05-18 DIAGNOSIS — Z8751 Personal history of pre-term labor: Secondary | ICD-10-CM

## 2024-05-18 DIAGNOSIS — E669 Obesity, unspecified: Secondary | ICD-10-CM

## 2024-05-18 DIAGNOSIS — O99210 Obesity complicating pregnancy, unspecified trimester: Secondary | ICD-10-CM

## 2024-05-18 NOTE — Progress Notes (Signed)
   PRENATAL VISIT NOTE  Subjective:  Brenda Richards is a 43 y.o. [phone] at [redacted]w[redacted]d being seen today for ongoing prenatal care.  She is currently monitored for the following issues for this high-risk pregnancy and has GERD (gastroesophageal reflux disease); History of cervical incompetence; History of preterm delivery/PPROM at 23 weeks; Supervision of high risk pregnancy, antepartum; AMA (advanced maternal age) multigravida 35+; History of cesarean section followed by successful vaginal birth (VBAC); Unwanted fertility; Alpha thalassemia silent carrier; Obesity affecting pregnancy, antepartum; and History of other child with congenital heart defect on their problem list.  Patient reports no complaints.  Contractions: Not present. Vag. Bleeding: None.  Movement: Present. Denies leaking of fluid.   The following portions of the patient's history were reviewed and updated as appropriate: allergies, current medications, past family history, past medical history, past social history, past surgical history and problem list.   Objective:    Vitals:   05/18/24 0812  BP: 110/73  Pulse: 80  Weight: 188 lb 6.4 oz (85.5 kg)    Fetal Status:  Fetal Heart Rate (bpm): 145   Movement: Present    General: Alert, oriented and cooperative. Patient is in no acute distress.  Skin: Skin is warm and dry. No rash noted.   Cardiovascular: Normal heart rate noted  Respiratory: Normal respiratory effort, no problems with respiration noted  Abdomen: Soft, gravid, appropriate for gestational age.  Pain/Pressure: Present     Pelvic: Cervical exam deferred        Extremities: Normal range of motion.  Edema: Trace  Mental Status: Normal mood and affect. Normal behavior. Normal judgment and thought content.   Assessment and Plan:  Pregnancy: [id number] at [redacted]w[redacted]d 1. Supervision of high risk pregnancy, antepartum (Primary)   2. History of cesarean section followed by successful vaginal birth (VBAC)   3.  Unwanted fertility   4. History of preterm delivery/PPROM at 23 weeks   5. Obesity affecting pregnancy, antepartum, unspecified obesity type   6. Multigravida of advanced maternal age in third trimester   Preterm labor symptoms and general obstetric precautions including but not limited to vaginal bleeding, contractions, leaking of fluid and fetal movement were reviewed in detail with the patient. Please refer to After Visit Summary for other counseling recommendations.   Return in about 2 weeks (around 06/01/2024).  Future Appointments  Date Time Provider Department Center  06/21/2024  7:00 AM [facility] PROVIDER 1 [facility]  06/21/2024  7:30 AM [facility] US1 [facility]  06/28/2024  7:00 AM [facility] PROVIDER 1 [facility]  06/28/2024  7:30 AM [facility] US1 [facility]  07/05/2024  7:00 AM [facility] PROVIDER 1 [facility]  07/05/2024  7:30 AM [facility] US1 [facility]  07/12/2024  7:00 AM [facility] PROVIDER 1 [facility]  07/12/2024  7:30 AM [facility] US1 [facility]  07/20/2024  7:00 AM [facility] PROVIDER 1 [facility]  07/20/2024  7:30 AM [facility] US1 [facility]    Lynwood Solomons, MD

## 2024-06-11 ENCOUNTER — Encounter: Admitting: Family Medicine

## 2024-06-12 ENCOUNTER — Encounter (HOSPITAL_COMMUNITY): Payer: Self-pay | Admitting: Obstetrics and Gynecology

## 2024-06-12 ENCOUNTER — Inpatient Hospital Stay (HOSPITAL_COMMUNITY)

## 2024-06-12 ENCOUNTER — Inpatient Hospital Stay (HOSPITAL_COMMUNITY)
Admission: AD | Admit: 2024-06-12 | Discharge: 2024-06-12 | Disposition: A | Discharge status: Home / Self Care | Attending: Obstetrics and Gynecology | Admitting: Obstetrics and Gynecology

## 2024-06-12 DIAGNOSIS — R0602 Shortness of breath: Secondary | ICD-10-CM | POA: Insufficient documentation

## 2024-06-12 DIAGNOSIS — O99613 Diseases of the digestive system complicating pregnancy, third trimester: Secondary | ICD-10-CM | POA: Insufficient documentation

## 2024-06-12 DIAGNOSIS — O26893 Other specified pregnancy related conditions, third trimester: Secondary | ICD-10-CM | POA: Insufficient documentation

## 2024-06-12 DIAGNOSIS — O26613 Liver and biliary tract disorders in pregnancy, third trimester: Secondary | ICD-10-CM | POA: Diagnosis not present

## 2024-06-12 DIAGNOSIS — K8020 Calculus of gallbladder without cholecystitis without obstruction: Secondary | ICD-10-CM | POA: Insufficient documentation

## 2024-06-12 DIAGNOSIS — O09523 Supervision of elderly multigravida, third trimester: Secondary | ICD-10-CM | POA: Diagnosis not present

## 2024-06-12 DIAGNOSIS — R1013 Epigastric pain: Secondary | ICD-10-CM

## 2024-06-12 DIAGNOSIS — Z3A33 33 weeks gestation of pregnancy: Secondary | ICD-10-CM | POA: Diagnosis not present

## 2024-06-12 DIAGNOSIS — K219 Gastro-esophageal reflux disease without esophagitis: Secondary | ICD-10-CM | POA: Diagnosis not present

## 2024-06-12 LAB — LIPASE, BLOOD: Lipase: 46 U/L (ref 11–51)

## 2024-06-12 LAB — URINALYSIS, ROUTINE W REFLEX MICROSCOPIC
Glucose, UA: NEGATIVE mg/dL
Hgb urine dipstick: NEGATIVE
Ketones, ur: NEGATIVE mg/dL
Leukocytes,Ua: NEGATIVE
Nitrite: NEGATIVE
Protein, ur: 100 mg/dL — AB
Specific Gravity, Urine: 1.024 (ref 1.005–1.030)
pH: 6 (ref 5.0–8.0)

## 2024-06-12 LAB — CBC
HCT: 39.1 % (ref 36.0–46.0)
Hemoglobin: 12.8 g/dL (ref 12.0–15.0)
MCH: 27.1 pg (ref 26.0–34.0)
MCHC: 32.7 g/dL (ref 30.0–36.0)
MCV: 82.7 fL (ref 80.0–100.0)
Platelets: 247 K/uL (ref 150–400)
RBC: 4.73 MIL/uL (ref 3.87–5.11)
RDW: 14.6 % (ref 11.5–15.5)
WBC: 9.3 K/uL (ref 4.0–10.5)
nRBC: 0 % (ref 0.0–0.2)

## 2024-06-12 LAB — COMPREHENSIVE METABOLIC PANEL WITH GFR
ALT: 47 U/L — ABNORMAL HIGH (ref 0–44)
AST: 75 U/L — ABNORMAL HIGH (ref 15–41)
Albumin: 3 g/dL — ABNORMAL LOW (ref 3.5–5.0)
Alkaline Phosphatase: 197 U/L — ABNORMAL HIGH (ref 38–126)
Anion gap: 9 (ref 5–15)
BUN: 8 mg/dL (ref 6–20)
CO2: 23 mmol/L (ref 22–32)
Calcium: 8.8 mg/dL — ABNORMAL LOW (ref 8.9–10.3)
Chloride: 105 mmol/L (ref 98–111)
Creatinine, Ser: 0.54 mg/dL (ref 0.44–1.00)
GFR, Estimated: >60 mL/min (ref >60)
Glucose, Bld: 98 mg/dL (ref 70–99)
Potassium: 3.5 mmol/L (ref 3.5–5.1)
Sodium: 137 mmol/L (ref 135–145)
Total Bilirubin: 1.2 mg/dL (ref 0.0–1.2)
Total Protein: 7 g/dL (ref 6.5–8.1)

## 2024-06-12 MED ORDER — FAMOTIDINE 20 MG PO TABS: 20.0000 mg | ORAL_TABLET | Freq: Two times a day (BID) | ORAL | 2 refills | Status: DC

## 2024-06-12 MED ORDER — ONDANSETRON 4 MG PO TBDP
4.0000 mg | ORAL_TABLET | Freq: Four times a day (QID) | ORAL | Status: DC | PRN
  Administered 2024-06-12: 4 mg via ORAL
  Filled 2024-06-12: qty 1

## 2024-06-12 MED ORDER — FAMOTIDINE 20 MG PO TABS
20.0000 mg | ORAL_TABLET | Freq: Once | ORAL | Status: AC
  Administered 2024-06-12: 20 mg via ORAL
  Filled 2024-06-12: qty 1

## 2024-06-12 MED ORDER — LACTATED RINGERS IV BOLUS
1000.0000 mL | Freq: Once | INTRAVENOUS | Status: AC
  Administered 2024-06-12: 1000 mL via INTRAVENOUS

## 2024-06-12 MED ORDER — ACETAMINOPHEN 500 MG PO TABS
1000.0000 mg | ORAL_TABLET | Freq: Once | ORAL | Status: AC
  Administered 2024-06-12: 1000 mg via ORAL
  Filled 2024-06-12: qty 2

## 2024-06-12 MED ORDER — ACETAMINOPHEN 500 MG PO TABS: 1000.0000 mg | ORAL_TABLET | Freq: Four times a day (QID) | ORAL | 2 refills | Status: DC | PRN

## 2024-06-12 NOTE — Discharge Instructions (Addendum)
 Please drink enough water that your urine is consistently a very pale yellow color!  HEARTBURN: START famotidine  20 mg once daily. You can use TUMS as needed for any breakthrough symptoms. Eating and drinking Do not drink alcohol while you are pregnant. Learn which foods and drinks make you feel worse, and avoid them. Avoid drinking a lot of liquid with your meals. Avoid eating meals during the 2-3 hours before you go to bed. Avoid lying down right after you eat. Do not exercise right after you eat. Drinks to avoid Coffee and tea, with or without caffeine. Energy drinks and sports drinks. Carbonated drinks or sodas. Citrus fruit juices. Foods to avoid Spicy foods and foods that have a lot of acid in them. These include: Peppers, chili powder, curry powder, vinegar, hot sauces, and barbecue sauce. Citrus fruits, such as oranges, lemons, and limes. Tomato-based foods, such as red sauce, chili, and salsa. High-fat foods, such as: Hot dogs, precooked or cured meats, sausage, ham, and bacon. Whole milk, butter, and cheese. Fried and fatty foods, such as donuts, french fries, potato chips, and high-fat dressings. Chocolate and cocoa. Mint. General instructions If helpful, you can try to raise the head of your bed about 6 inches (15 cm). You can do this by putting blocks under the legs. Sleeping with more pillows does not help with heartburn. Do not smoke or use any products that contain nicotine or tobacco. If you need help quitting, ask your doctor. Wear loose-fitting clothing. Try to lower your stress. You may do yoga or meditation. If you need help, ask your doctor. Stay at a healthy weight. If you are overweight, work with your doctor to safely manage your weight.  GALLSTONES: Gallstones are very common, especially during pregnancy. Some people do not have any symptoms, while other people have episodes of pain and/or nausea or vomiting. Usually, symptoms gradually worsen over about  1 hour, then reach a steady point that can be moderate to excruciating but remains constant about hour, then slowly subsides over several hours.  Preventing biliary colic Drink enough fluid to keep your urine pale yellow. Avoid triggers: Fatty, greasy, and fried foods. Any foods that make the pain worse. Overeating. Having a large meal after not eating for a while. Maintaining a healthy body weight. Getting regular exercise. Eating a healthy diet that is high in fiber and low in fat. Limiting how much sugar and refined carbohydrates you eat. During biliary colic episodes (also known as a gallbladder attack)  Take acetaminophen  (Tylenol ) 1000 mg for one dose as soon as you start to feel any symptoms. You can repeat this dose again if needed in 5-6 hours. Walking or gentle yoga poses like table top, cat/dog, or standing with your hands/arms on the bed or counter and letting your belly hang down can be helpful. Laying on your left side reduces the pressure on your gallbladder and can improve pain. A warm shower or bath may reduce pain. Make sure the water is warm and not hot as hot baths can be dangerous for pregnancy. The temperature should be close to body temperature (around 99 degrees Farenheit). Go to the hospital if: Your skin or the whites of your eyes look yellow (jaundice). Your have tea-colored urine and light-colored stools (feces). You are dizzy or you faint. Let your doctor know if: Your severe pain lasts more than 5 hours. You vomit. You have a fever and chills. Your pain gets worse.

## 2024-06-12 NOTE — MAU Note (Signed)
 Brenda Richards is a 43 y.o. at [redacted]w[redacted]d here in MAU reporting: SOB and upper mid abdominal pain along with upper mid back pain started that started yesterday afternoon. The pain is making her feel like she can't take a deep breath. States her abdomen feels tight and it is constant. Abdomen feel soft on palpation. Denies any VB. +FM   Denies any vomiting or diarrhea, reports feeling nauseated. Denies any heartburn.   Pt is nervous because of hx of preterm baby.    Onset of complaint: yesterday  Pain score: back 10 abdomen 10 Vitals:   06/12/24 1417  BP: 126/68  Pulse: (!) 113  Resp: 19  Temp: 98.1 F (36.7 C)  SpO2: 99%     FHT:153 Lab orders placed from triage:

## 2024-06-12 NOTE — MAU Provider Note (Signed)
 Chief Complaint:  Abdominal Pain and Shortness of Breath   HPI   None     Brenda Richards is a 43 y.o. [phone] at [redacted]w[redacted]d who presents to maternity admissions reporting upper abdominal pain and SOB. She reports abdominal pain started yesterday around 2000, located in epigastric region and radiating to upper/mid back. The pain makes deep breathing difficult. The pain resolved to let her sleep, but started again this morning. She also reports feeling that her epigastric region is tight which is a constant feeling. She denies heartburn, diarrhea. Endorses nausea. She endorses good fetal movement. She reports having lunch at a [place] buffet yesterday and was stuffed afterward.  Pregnancy Course: Receives care at [facility]. Prenatal records reviewed.   Past Medical History:  Diagnosis Date   Alpha thalassemia silent carrier 02/05/2024   Positive PPD, treated    OB History  Gravida Para Term Preterm AB Living  4 3 2 1  0 3  SAB IAB Ectopic Multiple Live Births  0 0 0 0 3    # Outcome Date GA Lbr Len/2nd Weight Sex Type Anes PTL Lv  4 Current           3 Preterm 08/29/15 [redacted]w[redacted]d 05:40 / 12:47 661 g M VBAC None  LIV  2 Term 07/17/12 [redacted]w[redacted]d  3615 g M CS-LVertical EPI  LIV  1 Term 2004 [redacted]w[redacted]d 10:00 3175 g M Vag-Spont EPI  LIV     Birth Comments: oligohydramnios   Past Surgical History:  Procedure Laterality Date   CESAREAN SECTION  07/17/2012   Procedure: CESAREAN SECTION;  Surgeon: Winton Felt, MD;  Location: [facility];  Service: Gynecology;  Laterality: N/A;   Family History  Problem Relation Age of Onset   Diabetes Mother    Anesthesia problems Neg Hx    Social History   Tobacco Use   Smoking status: Never   Smokeless tobacco: Never  Vaping Use   Vaping status: Never Used  Substance Use Topics   Alcohol use: Not Currently    Comment: in early pregnancy   Drug use: No   No Known Allergies Medications Prior to Admission  Medication Sig Dispense Refill Last Dose/Taking   EQ  ASPIRIN ADULT LOW DOSE 81 MG tablet Take 81 mg by mouth daily.   06/11/2024   Prenatal Vit-Fe Fumarate-FA (PRENATAL MULTIVITAMIN) TABS Take 1 tablet by mouth every morning.   06/11/2024   ondansetron  (ZOFRAN ) 4 MG tablet Take 1 tablet 3 times a day by oral route as needed, for pregnancy related nausea.      [DISCONTINUED] acetaminophen  (TYLENOL ) 500 MG tablet Take 2 tablets (1,000 mg total) by mouth every 6 (six) hours as needed for moderate pain (pain score 4-6), mild pain (pain score 1-3) or headache. 100 tablet 2     I have reviewed patient's Past Medical Hx, Surgical Hx, Family Hx, Social Hx, medications and allergies.   ROS  Pertinent items noted in HPI and remainder of comprehensive ROS otherwise negative.   PHYSICAL EXAM  Patient Vitals for the past 24 hrs:  BP Temp Temp src Pulse Resp SpO2 Height Weight  06/12/24 1435 125/75 -- -- (!) 111 -- 99 % -- --  06/12/24 1417 126/68 98.1 F (36.7 C) Oral (!) 113 19 99 % 5' 4 (1.626 m) 86.5 kg    Constitutional: Well-developed, well-nourished female in no acute distress.  HEENT: atraumatic, normocephalic. Neck has normal ROM. EOM intact. Cardiovascular: normal rate & rhythm, warm and well-perfused Respiratory: normal effort, no problems  with respiration noted GI: Abd soft, non-distended. Tenderness over RUQ, epigastric region, LUQ. Positive Murphy's sign. MSK: Extremities nontender, no edema, normal ROM Skin: warm and dry. Acyanotic, no jaundice or pallor. Neurologic: Alert and oriented x 4. No abnormal coordination. Psychiatric: Normal mood. Speech not slurred, not rapid/pressured. Patient is cooperative. GU: no CVA tenderness  Fetal Tracing: Baseline FHR: 145 per minute Fetal heart variability: moderate Fetal Heart Rate accelerations: yes Fetal Heart Rate decelerations: none Fetal Non-stress Test: Category I (reactive) Toco: uterine irritability   Labs: Results for orders placed or performed during the hospital encounter of  06/12/24 (from the past 24 hours)  CBC     Status: None   Collection Time: 06/12/24  2:57 PM  Result Value Ref Range   WBC 9.3 4.0 - 10.5 K/uL   RBC 4.73 3.87 - 5.11 MIL/uL   Hemoglobin 12.8 12.0 - 15.0 g/dL   HCT 60.8 63.9 - 53.9 %   MCV 82.7 80.0 - 100.0 fL   MCH 27.1 26.0 - 34.0 pg   MCHC 32.7 30.0 - 36.0 g/dL   RDW 85.3 88.4 - 84.4 %   Platelets 247 150 - 400 K/uL   nRBC 0.0 0.0 - 0.2 %  Comprehensive metabolic panel     Status: Abnormal   Collection Time: 06/12/24  2:57 PM  Result Value Ref Range   Sodium 137 135 - 145 mmol/L   Potassium 3.5 3.5 - 5.1 mmol/L   Chloride 105 98 - 111 mmol/L   CO2 23 22 - 32 mmol/L   Glucose, Bld 98 70 - 99 mg/dL   BUN 8 6 - 20 mg/dL   Creatinine, Ser 9.45 0.44 - 1.00 mg/dL   Calcium 8.8 (L) 8.9 - 10.3 mg/dL   Total Protein 7.0 6.5 - 8.1 g/dL   Albumin 3.0 (L) 3.5 - 5.0 g/dL   AST 75 (H) 15 - 41 U/L   ALT 47 (H) 0 - 44 U/L   Alkaline Phosphatase 197 (H) 38 - 126 U/L   Total Bilirubin 1.2 0.0 - 1.2 mg/dL   GFR, Estimated >39 >39 mL/min   Anion gap 9 5 - 15  Lipase, blood     Status: None   Collection Time: 06/12/24  2:57 PM  Result Value Ref Range   Lipase 46 11 - 51 U/L  Urinalysis, Routine w reflex microscopic -Urine, Clean Catch     Status: Abnormal   Collection Time: 06/12/24  2:57 PM  Result Value Ref Range   Color, Urine AMBER (A) YELLOW   APPearance HAZY (A) CLEAR   Specific Gravity, Urine 1.024 1.005 - 1.030   pH 6.0 5.0 - 8.0   Glucose, UA NEGATIVE NEGATIVE mg/dL   Hgb urine dipstick NEGATIVE NEGATIVE   Bilirubin Urine SMALL (A) NEGATIVE   Ketones, ur NEGATIVE NEGATIVE mg/dL   Protein, ur 899 (A) NEGATIVE mg/dL   Nitrite NEGATIVE NEGATIVE   Leukocytes,Ua NEGATIVE NEGATIVE   RBC / HPF 0-5 0 - 5 RBC/hpf   WBC, UA 0-5 0 - 5 WBC/hpf   Bacteria, UA RARE (A) NONE SEEN   Squamous Epithelial / HPF 11-20 0 - 5 /HPF   Mucus PRESENT    Ca Oxalate Crys, UA PRESENT     Imaging:  US  ABDOMEN LIMITED RUQ (LIVER/GB) Result Date:  06/12/2024 CLINICAL DATA:  Abdominal pain. EXAM: ULTRASOUND ABDOMEN LIMITED RIGHT UPPER QUADRANT COMPARISON:  None Available. FINDINGS: Gallbladder: Multiple stones in the gallbladder. No gallbladder wall thickening or pericholecystic fluid. Negative sonographic Murphy's  sign. Common bile duct: Diameter: 6 mm Liver: There is diffuse increased liver echogenicity most commonly seen in the setting of fatty infiltration. Superimposed inflammation or fibrosis is not excluded. Clinical correlation is recommended. portal vein is patent on color Doppler imaging with normal direction of blood flow towards the liver. Other: None. IMPRESSION: 1. Cholelithiasis without sonographic evidence of acute cholecystitis. 2. Fatty liver. Electronically Signed   By: Vanetta Chou M.D.   On: 06/12/2024 16:20    MDM & MAU COURSE  MDM: High  MAU Course: -Tachycardia, otherwise Vital signs within normal limits. -CBC, CMP, lipase to evaluate upper abdominal pain radiating to the back. Known history of cholelithiasis, RUQ US  to evaluate progression and rule out cholecystitis. -Zofran  and IV fluids for nausea and suspected biliary colic. Famotidine  for possible heartburn pending US  results. -UA amber with small bilirubin and Ca oxalate crystals.  -CMP significant for elevated alkaline phosphatase and AST/ALT. No previous values for comparison, known history of NAFLD and evidence of steatosis on RUQ US  today. Bilirubin within normal limits. -On reassessment, patient states she feels much better and is able to breath normally. -Discussed heartburn and/or cholelithiasis as likely cause of symptoms. Patient agreeable to starting famotidine  20 mg daily for GERD. Provided education on gallstones, biliary colic, prevention of episodes, and management of biliary colic pain.  Differential diagnosis considered for shortness of breath includes but is not limited to: physiologic SOB of pregnancy, URI, asthma, anemia Differential  diagnosis considered for upper abdominal pain includes but is not limited to: cholecystitis, biliary colic, GERD, appendicitis, constipation, pancreatitis  Orders Placed This Encounter  Procedures   US  ABDOMEN LIMITED RUQ (LIVER/GB)   CBC   Comprehensive metabolic panel   Lipase, blood   Urinalysis, Routine w reflex microscopic -Urine, Clean Catch   Discharge patient   Meds ordered this encounter  Medications   ondansetron  (ZOFRAN -ODT) disintegrating tablet 4 mg   lactated ringers  bolus 1,000 mL   famotidine  (PEPCID ) tablet 20 mg   acetaminophen  (TYLENOL ) tablet 1,000 mg   famotidine  (PEPCID ) 20 MG tablet    Sig: Take 1 tablet (20 mg total) by mouth 2 (two) times daily.    Dispense:  30 tablet    Refill:  2   acetaminophen  (TYLENOL ) 500 MG tablet    Sig: Take 2 tablets (1,000 mg total) by mouth every 6 (six) hours as needed for moderate pain (pain score 4-6), mild pain (pain score 1-3) or headache.    Dispense:  100 tablet    Refill:  2    ASSESSMENT   1. Gastroesophageal reflux disease without esophagitis   2. Calculus of gallbladder without cholecystitis without obstruction   3. Epigastric pain   4. [redacted] weeks gestation of pregnancy     PLAN  Discharge home in stable condition with return precautions.  Start famotidine  20 mg daily for GERD. Provided education on prevention of biliary colic.    Follow-up Information     Center for [facility] Follow up.   Specialty: Obstetrics and Gynecology Why: As scheduled for ongoing prenatal care Contact information: [address] [place]  [address] [phone]                 Allergies as of 06/12/2024   No Known Allergies      Medication List     TAKE these medications    acetaminophen  500 MG tablet Commonly known as: TYLENOL  Take 2 tablets (1,000 mg total) by mouth  every 6 (six) hours as needed for moderate pain (pain score 4-6), mild pain (pain  score 1-3) or headache.   EQ Aspirin Adult Low Dose 81 MG tablet Generic drug: aspirin EC Take 81 mg by mouth daily.   famotidine  20 MG tablet Commonly known as: PEPCID  Take 1 tablet (20 mg total) by mouth 2 (two) times daily.   ondansetron  4 MG tablet Commonly known as: ZOFRAN  Take 1 tablet 3 times a day by oral route as needed, for pregnancy related nausea.   prenatal multivitamin Tabs tablet Take 1 tablet by mouth every morning.        Joesph DELENA Sear, PA

## 2024-06-21 ENCOUNTER — Ambulatory Visit

## 2024-06-21 ENCOUNTER — Ambulatory Visit: Attending: Obstetrics and Gynecology | Admitting: Maternal & Fetal Medicine

## 2024-06-21 ENCOUNTER — Ambulatory Visit (HOSPITAL_BASED_OUTPATIENT_CLINIC_OR_DEPARTMENT_OTHER)

## 2024-06-21 VITALS — BP 125/72

## 2024-06-21 DIAGNOSIS — O09213 Supervision of pregnancy with history of pre-term labor, third trimester: Secondary | ICD-10-CM

## 2024-06-21 DIAGNOSIS — O09523 Supervision of elderly multigravida, third trimester: Secondary | ICD-10-CM

## 2024-06-21 DIAGNOSIS — Z3A34 34 weeks gestation of pregnancy: Secondary | ICD-10-CM | POA: Diagnosis not present

## 2024-06-21 DIAGNOSIS — Z8751 Personal history of pre-term labor: Secondary | ICD-10-CM | POA: Insufficient documentation

## 2024-06-21 DIAGNOSIS — O3413 Maternal care for benign tumor of corpus uteri, third trimester: Secondary | ICD-10-CM | POA: Diagnosis present

## 2024-06-21 DIAGNOSIS — D259 Leiomyoma of uterus, unspecified: Secondary | ICD-10-CM

## 2024-06-21 DIAGNOSIS — O0990 Supervision of high risk pregnancy, unspecified, unspecified trimester: Secondary | ICD-10-CM

## 2024-06-21 DIAGNOSIS — O99013 Anemia complicating pregnancy, third trimester: Secondary | ICD-10-CM

## 2024-06-21 DIAGNOSIS — Z8279 Family history of other congenital malformations, deformations and chromosomal abnormalities: Secondary | ICD-10-CM

## 2024-06-21 DIAGNOSIS — D563 Thalassemia minor: Secondary | ICD-10-CM | POA: Diagnosis not present

## 2024-06-21 DIAGNOSIS — O34219 Maternal care for unspecified type scar from previous cesarean delivery: Secondary | ICD-10-CM

## 2024-06-21 DIAGNOSIS — Z8742 Personal history of other diseases of the female genital tract: Secondary | ICD-10-CM

## 2024-06-21 DIAGNOSIS — O99210 Obesity complicating pregnancy, unspecified trimester: Secondary | ICD-10-CM

## 2024-06-21 NOTE — Progress Notes (Signed)
 After review, MFM consult with provider is not indicated for today  William Glenn, DO 06/21/2024 11:16 AM  [facility]

## 2024-06-23 NOTE — Addendum Note (Signed)
 Addended by: WILLIAM DELORA SAILOR on: 06/23/2024 09:08 AM   Modules accepted: Level of Service

## 2024-06-28 ENCOUNTER — Other Ambulatory Visit

## 2024-06-28 ENCOUNTER — Ambulatory Visit: Attending: Obstetrics and Gynecology | Admitting: Maternal & Fetal Medicine

## 2024-06-28 ENCOUNTER — Ambulatory Visit (HOSPITAL_BASED_OUTPATIENT_CLINIC_OR_DEPARTMENT_OTHER)

## 2024-06-28 VITALS — BP 118/68

## 2024-06-28 DIAGNOSIS — D259 Leiomyoma of uterus, unspecified: Secondary | ICD-10-CM

## 2024-06-28 DIAGNOSIS — O99213 Obesity complicating pregnancy, third trimester: Secondary | ICD-10-CM | POA: Diagnosis not present

## 2024-06-28 DIAGNOSIS — O09523 Supervision of elderly multigravida, third trimester: Secondary | ICD-10-CM | POA: Diagnosis not present

## 2024-06-28 DIAGNOSIS — O3413 Maternal care for benign tumor of corpus uteri, third trimester: Secondary | ICD-10-CM | POA: Diagnosis present

## 2024-06-28 DIAGNOSIS — O3410 Maternal care for benign tumor of corpus uteri, unspecified trimester: Secondary | ICD-10-CM

## 2024-06-28 DIAGNOSIS — D563 Thalassemia minor: Secondary | ICD-10-CM

## 2024-06-28 DIAGNOSIS — O0990 Supervision of high risk pregnancy, unspecified, unspecified trimester: Secondary | ICD-10-CM

## 2024-06-28 DIAGNOSIS — Z8751 Personal history of pre-term labor: Secondary | ICD-10-CM

## 2024-06-28 DIAGNOSIS — Z3A35 35 weeks gestation of pregnancy: Secondary | ICD-10-CM | POA: Diagnosis not present

## 2024-06-28 DIAGNOSIS — E669 Obesity, unspecified: Secondary | ICD-10-CM

## 2024-06-28 NOTE — Progress Notes (Signed)
 After review, MFM consult with provider is not indicated for today  William Glenn, DO 06/28/2024 8:44 AM  [facility]

## 2024-06-30 ENCOUNTER — Other Ambulatory Visit: Payer: Self-pay

## 2024-06-30 ENCOUNTER — Other Ambulatory Visit (HOSPITAL_COMMUNITY)
Admission: RE | Admit: 2024-06-30 | Discharge: 2024-06-30 | Disposition: A | Discharge status: Home / Self Care | Source: Ambulatory Visit | Attending: Family Medicine | Admitting: Family Medicine

## 2024-06-30 ENCOUNTER — Ambulatory Visit (INDEPENDENT_AMBULATORY_CARE_PROVIDER_SITE_OTHER): Admitting: Family Medicine

## 2024-06-30 VITALS — BP 115/78 | HR 89 | Wt 190.0 lb

## 2024-06-30 DIAGNOSIS — K219 Gastro-esophageal reflux disease without esophagitis: Secondary | ICD-10-CM

## 2024-06-30 DIAGNOSIS — Z3009 Encounter for other general counseling and advice on contraception: Secondary | ICD-10-CM

## 2024-06-30 DIAGNOSIS — Z3A36 36 weeks gestation of pregnancy: Secondary | ICD-10-CM

## 2024-06-30 DIAGNOSIS — K8020 Calculus of gallbladder without cholecystitis without obstruction: Secondary | ICD-10-CM

## 2024-06-30 DIAGNOSIS — Z3483 Encounter for supervision of other normal pregnancy, third trimester: Secondary | ICD-10-CM | POA: Diagnosis not present

## 2024-06-30 DIAGNOSIS — R300 Dysuria: Secondary | ICD-10-CM

## 2024-06-30 DIAGNOSIS — O0990 Supervision of high risk pregnancy, unspecified, unspecified trimester: Secondary | ICD-10-CM

## 2024-06-30 DIAGNOSIS — O99213 Obesity complicating pregnancy, third trimester: Secondary | ICD-10-CM

## 2024-06-30 DIAGNOSIS — O99210 Obesity complicating pregnancy, unspecified trimester: Secondary | ICD-10-CM

## 2024-06-30 DIAGNOSIS — Z8751 Personal history of pre-term labor: Secondary | ICD-10-CM

## 2024-06-30 DIAGNOSIS — O09523 Supervision of elderly multigravida, third trimester: Secondary | ICD-10-CM

## 2024-06-30 DIAGNOSIS — O34219 Maternal care for unspecified type scar from previous cesarean delivery: Secondary | ICD-10-CM

## 2024-06-30 DIAGNOSIS — Z8279 Family history of other congenital malformations, deformations and chromosomal abnormalities: Secondary | ICD-10-CM

## 2024-06-30 DIAGNOSIS — O0993 Supervision of high risk pregnancy, unspecified, third trimester: Secondary | ICD-10-CM

## 2024-06-30 LAB — POCT URINALYSIS DIP (DEVICE)
Bilirubin Urine: NEGATIVE
Glucose, UA: NEGATIVE mg/dL
Ketones, ur: NEGATIVE mg/dL
Nitrite: NEGATIVE
Protein, ur: NEGATIVE mg/dL
Specific Gravity, Urine: 1.025 (ref 1.005–1.030)
Urobilinogen, UA: 0.2 mg/dL (ref 0.0–1.0)
pH: 6 (ref 5.0–8.0)

## 2024-06-30 MED ORDER — CEFADROXIL 500 MG PO CAPS: 500.0000 mg | ORAL_CAPSULE | Freq: Two times a day (BID) | ORAL | 0 refills | Status: DC

## 2024-06-30 NOTE — Progress Notes (Signed)
 PRENATAL VISIT NOTE  Subjective:  Brenda Richards is a 43 y.o. [phone] at [redacted]w[redacted]d being seen today for ongoing prenatal care.  She is currently monitored for the following issues for this high-risk pregnancy and has GERD (gastroesophageal reflux disease); History of cervical incompetence; History of preterm delivery/PPROM at 23 weeks; Supervision of high risk pregnancy, antepartum; AMA (advanced maternal age) multigravida 35+; History of cesarean section followed by successful vaginal birth (VBAC); Unwanted fertility; Alpha thalassemia silent carrier; Obesity affecting pregnancy, antepartum; History of other child with congenital heart defect; Cholelithiasis without obstruction; and Fatty (change of) liver, not elsewhere classified on their problem list.  Patient reports no bleeding, no contractions, no cramping, and no leaking.  Contractions: Not present. Vag. Bleeding: None.  Movement: Present. Denies leaking of fluid.   The following portions of the patient's history were reviewed and updated as appropriate: allergies, current medications, past family history, past medical history, past social history, past surgical history and problem list.   Objective:    Vitals:   06/30/24 1355  BP: 115/78  Pulse: 89  Weight: 190 lb (86.2 kg)    Fetal Status:  Fetal Heart Rate (bpm): 143   Movement: Present    General: Alert, oriented and cooperative. Patient is in no acute distress.  Skin: Skin is warm and dry. No rash noted.   Cardiovascular: Normal heart rate noted  Respiratory: Normal respiratory effort, no problems with respiration noted  Abdomen: Soft, gravid, appropriate for gestational age.  Pain/Pressure: Present     Pelvic: Cervical exam deferred        Extremities: Normal range of motion.     Mental Status: Normal mood and affect. Normal behavior. Normal judgment and thought content.   Assessment and Plan:  Pregnancy: [id number] at [redacted]w[redacted]d 1. Supervision of high risk pregnancy,  antepartum (Primary) FHR BP appropriate today MFM recommended induction of labor between 39 and 40 weeks.  Patient elected for 39-week induction.  Scheduled for midnight induction on 07/20/2024.  Follow-up in 1 week.  2. Calculus of gallbladder without cholecystitis without obstruction Asymptomatic at this time  3. Gastroesophageal reflux disease without esophagitis  4. Unwanted fertility Desires tubal ligation Papers previously signed  5. History of cesarean section followed by successful vaginal birth (VBAC) Desires trial of labor after C-section, papers previously signed  6. Obesity affecting pregnancy, antepartum, unspecified obesity type Following with MFM  7. History of preterm delivery/PPROM at 23 weeks No signs of labor at this time  8. History of other child with congenital heart defect Low risk nips with normal ultrasounds  9. Multigravida of advanced maternal age in third trimester Following with MFM  10. [redacted] weeks gestation of pregnancy - Culture, beta strep (group b only) - GC/Chlamydia probe amp ([place])not at [facility]  11. Burning with urination Signs and symptoms of a UTI.  Urinalysis showing red blood cells, leukocytes, protein.  Will treat and send for culture. - Culture, OB Urine  Term labor symptoms and general obstetric precautions including but not limited to vaginal bleeding, contractions, leaking of fluid and fetal movement were reviewed in detail with the patient. Please refer to After Visit Summary for other counseling recommendations.   No follow-ups on file.  Future Appointments  Date Time Provider Department Center  07/05/2024  7:00 AM [facility] PROVIDER 1 [facility]  07/05/2024  7:30 AM [facility] US1 [facility]  07/07/2024  3:15 PM Ilean Norleen GAILS, MD [facility]  07/12/2024  7:00 AM [facility] PROVIDER 1 [facility]  07/12/2024  7:30 AM [facility] US1 [facility]  07/20/2024 12:00 AM [facility] SCHED ROOM [facility] None  07/20/2024  7:00 AM [facility] PROVIDER  1 [facility]  07/20/2024  7:30 AM [facility] US1 [facility]    Norleen LULLA Rover, MD

## 2024-07-02 LAB — GC/CHLAMYDIA PROBE AMP (~~LOC~~) NOT AT ARMC
Chlamydia: NEGATIVE
Comment: NEGATIVE
Comment: NORMAL
Neisseria Gonorrhea: NEGATIVE

## 2024-07-03 LAB — URINE CULTURE, OB REFLEX

## 2024-07-03 LAB — CULTURE, OB URINE

## 2024-07-04 LAB — CULTURE, BETA STREP (GROUP B ONLY): Strep Gp B Culture: NEGATIVE

## 2024-07-05 ENCOUNTER — Other Ambulatory Visit

## 2024-07-05 ENCOUNTER — Ambulatory Visit

## 2024-07-05 ENCOUNTER — Ambulatory Visit: Attending: Obstetrics and Gynecology

## 2024-07-05 VITALS — BP 119/78 | HR 79

## 2024-07-05 DIAGNOSIS — Z3A36 36 weeks gestation of pregnancy: Secondary | ICD-10-CM | POA: Insufficient documentation

## 2024-07-05 DIAGNOSIS — O0990 Supervision of high risk pregnancy, unspecified, unspecified trimester: Secondary | ICD-10-CM

## 2024-07-05 DIAGNOSIS — D563 Thalassemia minor: Secondary | ICD-10-CM

## 2024-07-05 DIAGNOSIS — E669 Obesity, unspecified: Secondary | ICD-10-CM

## 2024-07-05 DIAGNOSIS — O99213 Obesity complicating pregnancy, third trimester: Secondary | ICD-10-CM | POA: Insufficient documentation

## 2024-07-05 DIAGNOSIS — O99210 Obesity complicating pregnancy, unspecified trimester: Secondary | ICD-10-CM

## 2024-07-05 DIAGNOSIS — Z0289 Encounter for other administrative examinations: Secondary | ICD-10-CM

## 2024-07-05 DIAGNOSIS — O09523 Supervision of elderly multigravida, third trimester: Secondary | ICD-10-CM | POA: Insufficient documentation

## 2024-07-05 NOTE — Procedures (Signed)
 Brenda Richards 31-Dec-1980 [redacted]w[redacted]d  Fetus A Non-Stress Test Interpretation for 07/05/24  Indication: Advanced Maternal Age >40 years and Obesity - NST only  Fetal Heart Rate A Mode: External Baseline Rate (A): 150 bpm Variability: Moderate Accelerations: 15 x 15 Decelerations: None Multiple birth?: No  Uterine Activity Mode: Palpation, Toco Contraction Frequency (min): none noted Resting Tone Palpated: Relaxed  Interpretation (Fetal Testing) Nonstress Test Interpretation: Reactive Comments: Reviewed with Dr. William

## 2024-07-07 ENCOUNTER — Other Ambulatory Visit: Payer: Self-pay

## 2024-07-07 ENCOUNTER — Ambulatory Visit (INDEPENDENT_AMBULATORY_CARE_PROVIDER_SITE_OTHER): Admitting: Family Medicine

## 2024-07-07 VITALS — BP 115/76 | HR 82 | Wt 191.4 lb

## 2024-07-07 DIAGNOSIS — Z3009 Encounter for other general counseling and advice on contraception: Secondary | ICD-10-CM

## 2024-07-07 DIAGNOSIS — K219 Gastro-esophageal reflux disease without esophagitis: Secondary | ICD-10-CM | POA: Diagnosis not present

## 2024-07-07 DIAGNOSIS — O99210 Obesity complicating pregnancy, unspecified trimester: Secondary | ICD-10-CM

## 2024-07-07 DIAGNOSIS — O0993 Supervision of high risk pregnancy, unspecified, third trimester: Secondary | ICD-10-CM | POA: Diagnosis not present

## 2024-07-07 DIAGNOSIS — Z8279 Family history of other congenital malformations, deformations and chromosomal abnormalities: Secondary | ICD-10-CM

## 2024-07-07 DIAGNOSIS — Z8751 Personal history of pre-term labor: Secondary | ICD-10-CM

## 2024-07-07 DIAGNOSIS — Z3A37 37 weeks gestation of pregnancy: Secondary | ICD-10-CM

## 2024-07-07 DIAGNOSIS — O09523 Supervision of elderly multigravida, third trimester: Secondary | ICD-10-CM

## 2024-07-07 DIAGNOSIS — K8020 Calculus of gallbladder without cholecystitis without obstruction: Secondary | ICD-10-CM | POA: Diagnosis not present

## 2024-07-07 DIAGNOSIS — O0990 Supervision of high risk pregnancy, unspecified, unspecified trimester: Secondary | ICD-10-CM

## 2024-07-07 DIAGNOSIS — O34219 Maternal care for unspecified type scar from previous cesarean delivery: Secondary | ICD-10-CM

## 2024-07-07 DIAGNOSIS — O99213 Obesity complicating pregnancy, third trimester: Secondary | ICD-10-CM

## 2024-07-08 NOTE — Progress Notes (Signed)
   PRENATAL VISIT NOTE  Subjective:  Brenda Richards is a 43 y.o. [phone] at [redacted]w[redacted]d being seen today for ongoing prenatal care.  She is currently monitored for the following issues for this high-risk pregnancy and has GERD (gastroesophageal reflux disease); History of cervical incompetence; History of preterm delivery/PPROM at 23 weeks; Supervision of high risk pregnancy, antepartum; AMA (advanced maternal age) multigravida 35+; History of cesarean section followed by successful vaginal birth (VBAC); Unwanted fertility; Alpha thalassemia silent carrier; Obesity affecting pregnancy, antepartum; History of other child with congenital heart defect; Cholelithiasis without obstruction; and Fatty (change of) liver, not elsewhere classified on their problem list.  Patient reports no bleeding, no contractions, no cramping, and no leaking.  Contractions: Not present. Vag. Bleeding: None.  Movement: Present. Denies leaking of fluid.   The following portions of the patient's history were reviewed and updated as appropriate: allergies, current medications, past family history, past medical history, past social history, past surgical history and problem list.   Objective:    Vitals:   07/07/24 1545  BP: 115/76  Pulse: 82  Weight: 191 lb 7 oz (86.8 kg)    Fetal Status:  Fetal Heart Rate (bpm): 140   Movement: Present    General: Alert, oriented and cooperative. Patient is in no acute distress.  Skin: Skin is warm and dry. No rash noted.   Cardiovascular: Normal heart rate noted  Respiratory: Normal respiratory effort, no problems with respiration noted  Abdomen: Soft, gravid, appropriate for gestational age.  Pain/Pressure: Present     Pelvic: Cervical exam deferred        Extremities: Normal range of motion.  Edema: Trace  Mental Status: Normal mood and affect. Normal behavior. Normal judgment and thought content.   Assessment and Plan:  Pregnancy: [id number] at [redacted]w[redacted]d 1. Supervision of high  risk pregnancy, antepartum (Primary) FHR BP appropriate today Scheduled for induction of labor at 39 weeks.  Scheduled for induction on 07/20/2024.  2. Calculus of gallbladder without cholecystitis without obstruction Asymptomatic at this time  3. Gastroesophageal reflux disease without esophagitis  4. Unwanted fertility Desires tubal ligation Papers previously signed  5. History of cesarean section followed by successful vaginal birth (VBAC) Still desires trial of labor after C-section.  6. Obesity affecting pregnancy, antepartum, unspecified obesity type Following with MFM  7. History of preterm delivery/PPROM at 23 weeks No signs of labor at this time  8. History of other child with congenital heart defect Low risk nips  9. Multigravida of advanced maternal age in third trimester  30. [redacted] weeks gestation of pregnancy  Term labor symptoms and general obstetric precautions including but not limited to vaginal bleeding, contractions, leaking of fluid and fetal movement were reviewed in detail with the patient. Please refer to After Visit Summary for other counseling recommendations.   No follow-ups on file.  Future Appointments  Date Time Provider Department Center  07/12/2024  7:15 AM [facility] PROVIDER 1 [facility]  07/12/2024  7:30 AM [facility] US1 [facility]  07/16/2024 10:35 AM Ilean Norleen GAILS, MD [facility]  07/20/2024 12:00 AM [facility] SCHED ROOM [facility] None  07/20/2024  7:00 AM [facility] PROVIDER 1 [facility]  07/20/2024  7:30 AM [facility] US1 [facility]    Norleen GAILS Ilean, MD

## 2024-07-12 ENCOUNTER — Ambulatory Visit: Admitting: Maternal & Fetal Medicine

## 2024-07-12 ENCOUNTER — Other Ambulatory Visit

## 2024-07-12 ENCOUNTER — Ambulatory Visit

## 2024-07-12 ENCOUNTER — Ambulatory Visit: Attending: Obstetrics and Gynecology | Admitting: *Deleted

## 2024-07-12 VITALS — BP 123/69 | HR 71

## 2024-07-12 DIAGNOSIS — O09213 Supervision of pregnancy with history of pre-term labor, third trimester: Secondary | ICD-10-CM | POA: Insufficient documentation

## 2024-07-12 DIAGNOSIS — O09523 Supervision of elderly multigravida, third trimester: Secondary | ICD-10-CM | POA: Insufficient documentation

## 2024-07-12 DIAGNOSIS — K8000 Calculus of gallbladder with acute cholecystitis without obstruction: Secondary | ICD-10-CM

## 2024-07-12 DIAGNOSIS — E669 Obesity, unspecified: Secondary | ICD-10-CM | POA: Diagnosis not present

## 2024-07-12 DIAGNOSIS — O3413 Maternal care for benign tumor of corpus uteri, third trimester: Secondary | ICD-10-CM | POA: Diagnosis not present

## 2024-07-12 DIAGNOSIS — D259 Leiomyoma of uterus, unspecified: Secondary | ICD-10-CM | POA: Insufficient documentation

## 2024-07-12 DIAGNOSIS — O0990 Supervision of high risk pregnancy, unspecified, unspecified trimester: Secondary | ICD-10-CM

## 2024-07-12 DIAGNOSIS — O99213 Obesity complicating pregnancy, third trimester: Secondary | ICD-10-CM | POA: Diagnosis not present

## 2024-07-12 DIAGNOSIS — D563 Thalassemia minor: Secondary | ICD-10-CM

## 2024-07-12 DIAGNOSIS — Z3A37 37 weeks gestation of pregnancy: Secondary | ICD-10-CM

## 2024-07-12 NOTE — Procedures (Signed)
 Brenda Richards 1981/05/14 [redacted]w[redacted]d  Fetus A Non-Stress Test Interpretation for 07/12/24  Indication: Obesity  Fetal Heart Rate A Mode: External Baseline Rate (A): 130 bpm Variability: Moderate Accelerations: 15 x 15 Decelerations: None Multiple birth?: No  Uterine Activity Mode: Palpation, Toco Contraction Frequency (min): None Resting Tone Palpated: Relaxed Resting Time: Adequate  Interpretation (Fetal Testing) Nonstress Test Interpretation: Reactive Comments: Dr. William reviewed tracing.

## 2024-07-15 ENCOUNTER — Encounter (HOSPITAL_COMMUNITY): Payer: Self-pay | Admitting: *Deleted

## 2024-07-15 ENCOUNTER — Inpatient Hospital Stay (HOSPITAL_COMMUNITY)
Admission: AD | Admit: 2024-07-15 | Discharge: 2024-07-15 | Disposition: A | Discharge status: Home / Self Care | Source: Home / Self Care | Attending: Obstetrics and Gynecology | Admitting: Obstetrics and Gynecology

## 2024-07-15 ENCOUNTER — Telehealth (HOSPITAL_COMMUNITY): Payer: Self-pay | Admitting: *Deleted

## 2024-07-15 ENCOUNTER — Encounter (HOSPITAL_COMMUNITY): Payer: Self-pay | Admitting: Obstetrics and Gynecology

## 2024-07-15 ENCOUNTER — Other Ambulatory Visit: Payer: Self-pay

## 2024-07-15 DIAGNOSIS — N898 Other specified noninflammatory disorders of vagina: Secondary | ICD-10-CM | POA: Insufficient documentation

## 2024-07-15 DIAGNOSIS — Z3A38 38 weeks gestation of pregnancy: Secondary | ICD-10-CM | POA: Diagnosis not present

## 2024-07-15 DIAGNOSIS — D563 Thalassemia minor: Secondary | ICD-10-CM

## 2024-07-15 DIAGNOSIS — O0990 Supervision of high risk pregnancy, unspecified, unspecified trimester: Secondary | ICD-10-CM

## 2024-07-15 DIAGNOSIS — O134 Gestational [pregnancy-induced] hypertension without significant proteinuria, complicating childbirth: Secondary | ICD-10-CM | POA: Diagnosis not present

## 2024-07-15 DIAGNOSIS — O26893 Other specified pregnancy related conditions, third trimester: Secondary | ICD-10-CM | POA: Diagnosis not present

## 2024-07-15 DIAGNOSIS — I10 Essential (primary) hypertension: Secondary | ICD-10-CM

## 2024-07-15 DIAGNOSIS — O99891 Other specified diseases and conditions complicating pregnancy: Secondary | ICD-10-CM

## 2024-07-15 DIAGNOSIS — O10013 Pre-existing essential hypertension complicating pregnancy, third trimester: Secondary | ICD-10-CM | POA: Insufficient documentation

## 2024-07-15 LAB — COMPREHENSIVE METABOLIC PANEL WITH GFR
ALT: 20 U/L (ref 0–44)
AST: 25 U/L (ref 15–41)
Albumin: 2.8 g/dL — ABNORMAL LOW (ref 3.5–5.0)
Alkaline Phosphatase: 167 U/L — ABNORMAL HIGH (ref 38–126)
Anion gap: 11 (ref 5–15)
BUN: 8 mg/dL (ref 6–20)
CO2: 20 mmol/L — ABNORMAL LOW (ref 22–32)
Calcium: 9 mg/dL (ref 8.9–10.3)
Chloride: 106 mmol/L (ref 98–111)
Creatinine, Ser: 0.68 mg/dL (ref 0.44–1.00)
GFR, Estimated: >60 mL/min (ref >60)
Glucose, Bld: 102 mg/dL — ABNORMAL HIGH (ref 70–99)
Potassium: 3.7 mmol/L (ref 3.5–5.1)
Sodium: 137 mmol/L (ref 135–145)
Total Bilirubin: 0.5 mg/dL (ref 0.0–1.2)
Total Protein: 6.3 g/dL — ABNORMAL LOW (ref 6.5–8.1)

## 2024-07-15 LAB — CBC
HCT: 36.6 % (ref 36.0–46.0)
Hemoglobin: 12.1 g/dL (ref 12.0–15.0)
MCH: 27 pg (ref 26.0–34.0)
MCHC: 33.1 g/dL (ref 30.0–36.0)
MCV: 81.7 fL (ref 80.0–100.0)
Platelets: 239 K/uL (ref 150–400)
RBC: 4.48 MIL/uL (ref 3.87–5.11)
RDW: 14.8 % (ref 11.5–15.5)
WBC: 8.7 K/uL (ref 4.0–10.5)
nRBC: 0 % (ref 0.0–0.2)

## 2024-07-15 LAB — RUPTURE OF MEMBRANE (ROM)PLUS: Rom Plus: NEGATIVE

## 2024-07-15 LAB — PROTEIN / CREATININE RATIO, URINE
Creatinine, Urine: 133 mg/dL
Protein Creatinine Ratio: 0.2 mg/mg{creat} — ABNORMAL HIGH (ref 0.00–0.15)
Total Protein, Urine: 26 mg/dL

## 2024-07-15 LAB — POCT FERN TEST: POCT Fern Test: NEGATIVE

## 2024-07-15 NOTE — Telephone Encounter (Signed)
 Preadmission screen C/o mucus discharge requiring pantiliner changing frequently.  Says it is mucus and watery.  Instructed to come to MAU for r/o ROM is leaking fluid.

## 2024-07-15 NOTE — Telephone Encounter (Signed)
 Preadmission screen

## 2024-07-15 NOTE — MAU Provider Note (Addendum)
 History     CSN: [id number]  Arrival date and time: 07/15/24 1636   None     Chief Complaint  Patient presents with   Rupture of Membranes   HPI Patient presenting to the MAU for evaluation for leaking of fluids.  Reports that she was having increased discharge but noticed a clear thin leaking of fluid starting at approximately 12 PM today.  She has had to change pantiliners twice.  Denies any other symptoms.  Denies any headaches, change in vision, right upper quadrant pain.  Denies any worsening swelling.  OB History     Gravida  4   Para  3   Term  2   Preterm  1   AB  0   Living  3      SAB  0   IAB  0   Ectopic  0   Multiple  0   Live Births  3           Past Medical History:  Diagnosis Date   Alpha thalassemia silent carrier 02/05/2024   Anemia    Gall stones    Positive PPD, treated    Umbilical hernia     Past Surgical History:  Procedure Laterality Date   CESAREAN SECTION  07/17/2012   Procedure: CESAREAN SECTION;  Surgeon: Winton Felt, MD;  Location: [facility];  Service: Gynecology;  Laterality: N/A;    Family History  Problem Relation Age of Onset   Diabetes Mother    Anesthesia problems Neg Hx     Social History   Tobacco Use   Smoking status: Never   Smokeless tobacco: Never  Vaping Use   Vaping status: Never Used  Substance Use Topics   Alcohol use: Not Currently    Comment: in early pregnancy   Drug use: No    Allergies: No Known Allergies  Medications Prior to Admission  Medication Sig Dispense Refill Last Dose/Taking   acetaminophen  (TYLENOL ) 500 MG tablet Take 2 tablets (1,000 mg total) by mouth every 6 (six) hours as needed for moderate pain (pain score 4-6), mild pain (pain score 1-3) or headache. 100 tablet 2 Past Week   EQ ASPIRIN ADULT LOW DOSE 81 MG tablet Take 81 mg by mouth daily.   07/14/2024   famotidine  (PEPCID ) 20 MG tablet Take 1 tablet (20 mg total) by mouth 2 (two) times daily. 30 tablet 2  07/15/2024   ondansetron  (ZOFRAN ) 4 MG tablet Take 1 tablet 3 times a day by oral route as needed, for pregnancy related nausea.   Past Week   Prenatal Vit-Fe Fumarate-FA (PRENATAL MULTIVITAMIN) TABS Take 1 tablet by mouth every morning.   07/14/2024   cefadroxil  (DURICEF) 500 MG capsule Take 1 capsule (500 mg total) by mouth 2 (two) times daily. 14 capsule 0     Review of Systems  Gastrointestinal:  Negative for abdominal pain.  Genitourinary:  Positive for vaginal discharge. Negative for vaginal bleeding.  All other systems reviewed and are negative.  Physical Exam   Blood pressure 128/76, pulse 88, temperature 98.3 F (36.8 C), temperature source Oral, resp. rate 16, height 5' 4 (1.626 m), weight 87.9 kg, last menstrual period 10/21/2023, SpO2 98%.  Physical Exam Vitals and nursing note reviewed.  Constitutional:      Appearance: Normal appearance.  HENT:     Head: Normocephalic and atraumatic.     Nose: No congestion or rhinorrhea.  Eyes:     Extraocular Movements: Extraocular movements intact.  Cardiovascular:  Rate and Rhythm: Normal rate.  Pulmonary:     Effort: Pulmonary effort is normal.  Abdominal:     Palpations: Abdomen is soft.     Tenderness: There is no abdominal tenderness.  Genitourinary:    Comments: 3/70/-2 Musculoskeletal:        General: Normal range of motion.     Cervical back: Normal range of motion.  Skin:    General: Skin is warm.     Capillary Refill: Capillary refill takes less than 2 seconds.  Neurological:     General: No focal deficit present.     Mental Status: She is alert.     Cranial Nerves: No cranial nerve deficit.  Psychiatric:        Mood and Affect: Mood normal.        Behavior: Behavior normal.     MAU Course  Procedures  MDM CBC CMP PC ratio  Assessment and Plan  Brenda Richards is a 43 yo G4P2103 at [redacted]w[redacted]d presenting for leaking of fluids.  Leaking of fluids Patient reports she noticed leaking of fluids  at approximately noon today.  Could be just normal pregnancy discharge.  Fern test negative.  ROM plus was also negative.  No continued leaking of fluid.  Elevated blood pressure without the diagnosis of hypertension Initial blood pressure with diastolic elevation in the 90s.  This was in triage.  When she was taken to the room her blood pressure was taken and it was 140s/90s.  The rest of her blood pressures while I was working were 120s/80s to the 130s/80s.  CBC within normal limits.  CMP within normal limits.  PC ratio 0.2.  Waiting for for full hours to see if she rules in for gestational hypertension.  If she does not I will follow-up with her in clinic tomorrow.  She is scheduled for induction of labor on Monday.  Printed off to look OB provider who will follow-up on blood pressures and admit if she has elevated or discharge if they remain within normal limits.  Discussed preeclampsia precautions.  John V Cresenzo 07/15/2024, 6:14 PM   Reassessment (8:28 PM) -BP remain stable. -Patient with appt tomorrow at 1035am. -Plan for discharge.  Reassessment (9:12 PM) -Patient with baseline change and monitored for extended period. -Cat I FT noted. -Nurse to give precautions. -Return to MAU as needed. -Encouraged to call primary office or return to MAU if symptoms worsen or with the onset of new symptoms.  Brenda LITTIE Duncans MSN, CNM Advanced Practice Provider, [facility] for [employer]

## 2024-07-15 NOTE — MAU Note (Signed)
 Brenda Richards is a 43 y.o. at [redacted]w[redacted]d here in MAU reporting: she has been leaking since today @ 1200 this afternoon, reports fluid is clear.  States has had to change panty liner twice today.  Denies VB.  Reports +FM.  LMP: 10/21/2023 Onset of complaint: today Pain score: 0 Vitals:   07/15/24 1706  BP: (!) 127/99  Pulse: (!) 104  Resp: 20  Temp: 98.3 F (36.8 C)  SpO2: 99%     FHT: 140 bpm  Lab orders placed from triage: None

## 2024-07-16 ENCOUNTER — Ambulatory Visit (INDEPENDENT_AMBULATORY_CARE_PROVIDER_SITE_OTHER): Admitting: Family Medicine

## 2024-07-16 VITALS — BP 124/77 | HR 93 | Wt 192.0 lb

## 2024-07-16 DIAGNOSIS — Z3A38 38 weeks gestation of pregnancy: Secondary | ICD-10-CM

## 2024-07-16 DIAGNOSIS — O0993 Supervision of high risk pregnancy, unspecified, third trimester: Secondary | ICD-10-CM

## 2024-07-16 DIAGNOSIS — D563 Thalassemia minor: Secondary | ICD-10-CM

## 2024-07-16 DIAGNOSIS — O0990 Supervision of high risk pregnancy, unspecified, unspecified trimester: Secondary | ICD-10-CM

## 2024-07-16 DIAGNOSIS — I10 Essential (primary) hypertension: Secondary | ICD-10-CM | POA: Diagnosis not present

## 2024-07-16 NOTE — Progress Notes (Signed)
   PRENATAL VISIT NOTE  Subjective:  Brenda Richards is a 43 y.o. [phone] at [redacted]w[redacted]d being seen today for ongoing prenatal care.  She is currently monitored for the following issues for this high-risk pregnancy and has GERD (gastroesophageal reflux disease); History of cervical incompetence; History of preterm delivery/PPROM at 23 weeks; Supervision of high risk pregnancy, antepartum; AMA (advanced maternal age) multigravida 35+; History of cesarean section followed by successful vaginal birth (VBAC); Unwanted fertility; Alpha thalassemia silent carrier; Obesity affecting pregnancy, antepartum; History of other child with congenital heart defect; Cholelithiasis without obstruction; and Fatty (change of) liver, not elsewhere classified on their problem list.  Patient reports no bleeding, no contractions, no cramping, and no leaking.  Contractions: Irregular. Vag. Bleeding: None.  Movement: Present. Denies leaking of fluid.   The following portions of the patient's history were reviewed and updated as appropriate: allergies, current medications, past family history, past medical history, past social history, past surgical history and problem list.   Objective:    Vitals:   07/16/24 1057  BP: 124/77  Pulse: 93  Weight: 192 lb (87.1 kg)    Fetal Status:  Fetal Heart Rate (bpm): 145   Movement: Present    General: Alert, oriented and cooperative. Patient is in no acute distress.  Skin: Skin is warm and dry. No rash noted.   Cardiovascular: Normal heart rate noted  Respiratory: Normal respiratory effort, no problems with respiration noted  Abdomen: Soft, gravid, appropriate for gestational age.  Pain/Pressure: Absent     Pelvic: Cervical exam deferred      3 cm yesterday in the MAU.  Cephalic  Extremities: Normal range of motion.  Edema: Trace  Mental Status: Normal mood and affect. Normal behavior. Normal judgment and thought content.   Assessment and Plan:  Pregnancy: [id number] at  [redacted]w[redacted]d 1. Supervision of high risk pregnancy, antepartum (Primary) FHR BP appropriate today  2. Alpha thalassemia silent carrier  3. Elevated blood pressure reading with diagnosis of hypertension Blood pressure appropriate today Normal Pree labs yesterday Induction of labor scheduled for 39 weeks  4. [redacted] weeks gestation of pregnancy IOL at 39 weeks  Term labor symptoms and general obstetric precautions including but not limited to vaginal bleeding, contractions, leaking of fluid and fetal movement were reviewed in detail with the patient. Please refer to After Visit Summary for other counseling recommendations.   No follow-ups on file.  Future Appointments  Date Time Provider Department Center  07/20/2024 12:00 AM [facility] SCHED ROOM [facility] None  08/31/2024 11:15 AM Izell Harari, MD [facility]    Norleen LULLA Rover, MD

## 2024-07-17 ENCOUNTER — Inpatient Hospital Stay (HOSPITAL_COMMUNITY): Admitting: Anesthesiology

## 2024-07-17 ENCOUNTER — Inpatient Hospital Stay (HOSPITAL_COMMUNITY)
Admission: AD | Admit: 2024-07-17 | Discharge: 2024-07-19 | DRG: 797 | Disposition: A | Discharge status: Home / Self Care | Payer: Self-pay | Attending: Obstetrics & Gynecology | Admitting: Obstetrics & Gynecology

## 2024-07-17 ENCOUNTER — Encounter (HOSPITAL_COMMUNITY): Payer: Self-pay | Admitting: Obstetrics & Gynecology

## 2024-07-17 ENCOUNTER — Other Ambulatory Visit: Payer: Self-pay

## 2024-07-17 DIAGNOSIS — Z302 Encounter for sterilization: Secondary | ICD-10-CM | POA: Diagnosis not present

## 2024-07-17 DIAGNOSIS — O134 Gestational [pregnancy-induced] hypertension without significant proteinuria, complicating childbirth: Principal | ICD-10-CM | POA: Diagnosis present

## 2024-07-17 DIAGNOSIS — K219 Gastro-esophageal reflux disease without esophagitis: Secondary | ICD-10-CM | POA: Diagnosis present

## 2024-07-17 DIAGNOSIS — Z7982 Long term (current) use of aspirin: Secondary | ICD-10-CM

## 2024-07-17 DIAGNOSIS — Z833 Family history of diabetes mellitus: Secondary | ICD-10-CM | POA: Diagnosis not present

## 2024-07-17 DIAGNOSIS — Z148 Genetic carrier of other disease: Secondary | ICD-10-CM | POA: Diagnosis not present

## 2024-07-17 DIAGNOSIS — O9962 Diseases of the digestive system complicating childbirth: Secondary | ICD-10-CM | POA: Diagnosis present

## 2024-07-17 DIAGNOSIS — O34211 Maternal care for low transverse scar from previous cesarean delivery: Secondary | ICD-10-CM | POA: Diagnosis not present

## 2024-07-17 DIAGNOSIS — O26893 Other specified pregnancy related conditions, third trimester: Secondary | ICD-10-CM | POA: Diagnosis present

## 2024-07-17 DIAGNOSIS — D62 Acute posthemorrhagic anemia: Secondary | ICD-10-CM | POA: Diagnosis not present

## 2024-07-17 DIAGNOSIS — Z603 Acculturation difficulty: Secondary | ICD-10-CM | POA: Diagnosis present

## 2024-07-17 DIAGNOSIS — Z3A38 38 weeks gestation of pregnancy: Secondary | ICD-10-CM | POA: Diagnosis not present

## 2024-07-17 DIAGNOSIS — O09523 Supervision of elderly multigravida, third trimester: Secondary | ICD-10-CM | POA: Diagnosis not present

## 2024-07-17 DIAGNOSIS — O0990 Supervision of high risk pregnancy, unspecified, unspecified trimester: Principal | ICD-10-CM

## 2024-07-17 DIAGNOSIS — O139 Gestational [pregnancy-induced] hypertension without significant proteinuria, unspecified trimester: Secondary | ICD-10-CM | POA: Insufficient documentation

## 2024-07-17 DIAGNOSIS — Z3009 Encounter for other general counseling and advice on contraception: Secondary | ICD-10-CM | POA: Diagnosis present

## 2024-07-17 DIAGNOSIS — O9081 Anemia of the puerperium: Secondary | ICD-10-CM | POA: Diagnosis not present

## 2024-07-17 DIAGNOSIS — O34219 Maternal care for unspecified type scar from previous cesarean delivery: Secondary | ICD-10-CM | POA: Diagnosis present

## 2024-07-17 DIAGNOSIS — O99214 Obesity complicating childbirth: Secondary | ICD-10-CM | POA: Diagnosis not present

## 2024-07-17 DIAGNOSIS — D563 Thalassemia minor: Secondary | ICD-10-CM

## 2024-07-17 DIAGNOSIS — O09529 Supervision of elderly multigravida, unspecified trimester: Secondary | ICD-10-CM

## 2024-07-17 LAB — PROTEIN / CREATININE RATIO, URINE
Creatinine, Urine: 101 mg/dL
Protein Creatinine Ratio: 0.26 mg/mg{creat} — ABNORMAL HIGH (ref 0.00–0.15)
Total Protein, Urine: 26 mg/dL

## 2024-07-17 LAB — CBC
HCT: 39.6 % (ref 36.0–46.0)
Hemoglobin: 13.1 g/dL (ref 12.0–15.0)
MCH: 27.4 pg (ref 26.0–34.0)
MCHC: 33.1 g/dL (ref 30.0–36.0)
MCV: 82.8 fL (ref 80.0–100.0)
Platelets: 227 K/uL (ref 150–400)
RBC: 4.78 MIL/uL (ref 3.87–5.11)
RDW: 14.6 % (ref 11.5–15.5)
WBC: 10.6 K/uL — ABNORMAL HIGH (ref 4.0–10.5)
nRBC: 0 % (ref 0.0–0.2)

## 2024-07-17 LAB — COMPREHENSIVE METABOLIC PANEL WITH GFR
ALT: 22 U/L (ref 0–44)
AST: 28 U/L (ref 15–41)
Albumin: 2.9 g/dL — ABNORMAL LOW (ref 3.5–5.0)
Alkaline Phosphatase: 204 U/L — ABNORMAL HIGH (ref 38–126)
Anion gap: 12 (ref 5–15)
BUN: 10 mg/dL (ref 6–20)
CO2: 20 mmol/L — ABNORMAL LOW (ref 22–32)
Calcium: 9.5 mg/dL (ref 8.9–10.3)
Chloride: 105 mmol/L (ref 98–111)
Creatinine, Ser: 0.66 mg/dL (ref 0.44–1.00)
GFR, Estimated: >60 mL/min (ref >60)
Glucose, Bld: 125 mg/dL — ABNORMAL HIGH (ref 70–99)
Potassium: 3.4 mmol/L — ABNORMAL LOW (ref 3.5–5.1)
Sodium: 137 mmol/L (ref 135–145)
Total Bilirubin: 0.5 mg/dL (ref 0.0–1.2)
Total Protein: 6.9 g/dL (ref 6.5–8.1)

## 2024-07-17 LAB — RPR: RPR Ser Ql: NONREACTIVE

## 2024-07-17 MED ORDER — DIBUCAINE (PERIANAL) 1 % EX OINT
1.0000 | TOPICAL_OINTMENT | CUTANEOUS | Status: DC | PRN
Start: 2024-07-17 — End: 2024-07-19

## 2024-07-17 MED ORDER — FLEET ENEMA RE ENEM
1.0000 | ENEMA | RECTAL | Status: DC | PRN
Start: 2024-07-17 — End: 2024-07-19

## 2024-07-17 MED ORDER — WITCH HAZEL-GLYCERIN EX PADS: 1.0000 | MEDICATED_PAD | CUTANEOUS | Status: DC | PRN

## 2024-07-17 MED ORDER — PHENYLEPHRINE 80 MCG/ML (10ML) SYRINGE FOR IV PUSH (FOR BLOOD PRESSURE SUPPORT): 80.0000 ug | PREFILLED_SYRINGE | INTRAVENOUS | Status: DC | PRN

## 2024-07-17 MED ORDER — OXYCODONE-ACETAMINOPHEN 5-325 MG PO TABS
2.0000 | ORAL_TABLET | ORAL | Status: DC | PRN
  Administered 2024-07-18 – 2024-07-19 (×3): 2 via ORAL
  Filled 2024-07-17 (×3): qty 2

## 2024-07-17 MED ORDER — SENNOSIDES-DOCUSATE SODIUM 8.6-50 MG PO TABS
2.0000 | ORAL_TABLET | Freq: Every day | ORAL | Status: DC
  Administered 2024-07-18 – 2024-07-19 (×2): 2 via ORAL
  Filled 2024-07-17 (×2): qty 2

## 2024-07-17 MED ORDER — LACTATED RINGERS IV SOLN: INTRAVENOUS | Status: AC

## 2024-07-17 MED ORDER — DIPHENHYDRAMINE HCL 50 MG/ML IJ SOLN: 12.5000 mg | INTRAMUSCULAR | Status: DC | PRN

## 2024-07-17 MED ORDER — OXYTOCIN BOLUS FROM INFUSION
333.0000 mL | Freq: Once | INTRAVENOUS | Status: AC
  Administered 2024-07-17: 333 mL via INTRAVENOUS

## 2024-07-17 MED ORDER — METOCLOPRAMIDE HCL 10 MG PO TABS
10.0000 mg | ORAL_TABLET | Freq: Once | ORAL | Status: AC
  Administered 2024-07-17: 10 mg via ORAL
  Filled 2024-07-17: qty 1

## 2024-07-17 MED ORDER — IBUPROFEN 600 MG PO TABS
600.0000 mg | ORAL_TABLET | Freq: Four times a day (QID) | ORAL | Status: DC
  Administered 2024-07-17 – 2024-07-19 (×6): 600 mg via ORAL
  Filled 2024-07-17 (×6): qty 1

## 2024-07-17 MED ORDER — ONDANSETRON HCL 4 MG/2ML IJ SOLN
4.0000 mg | Freq: Four times a day (QID) | INTRAMUSCULAR | Status: DC | PRN
Start: 2024-07-17 — End: 2024-07-17

## 2024-07-17 MED ORDER — LIDOCAINE HCL (PF) 1 % IJ SOLN: 30.0000 mL | INTRAMUSCULAR | Status: DC | PRN

## 2024-07-17 MED ORDER — HYDRALAZINE HCL 20 MG/ML IJ SOLN: 5.0000 mg | INTRAMUSCULAR | Status: DC | PRN

## 2024-07-17 MED ORDER — OXYTOCIN-SODIUM CHLORIDE 30-0.9 UT/500ML-% IV SOLN
1.0000 m[IU]/min | INTRAVENOUS | Status: DC
  Administered 2024-07-17: 2 m[IU]/min via INTRAVENOUS

## 2024-07-17 MED ORDER — LABETALOL HCL 5 MG/ML IV SOLN: 40.0000 mg | INTRAVENOUS | Status: DC | PRN

## 2024-07-17 MED ORDER — OXYTOCIN-SODIUM CHLORIDE 30-0.9 UT/500ML-% IV SOLN
2.5000 [IU]/h | INTRAVENOUS | Status: DC
  Filled 2024-07-17 (×2): qty 500

## 2024-07-17 MED ORDER — LIDOCAINE HCL (PF) 1 % IJ SOLN
INTRAMUSCULAR | Status: DC | PRN
  Administered 2024-07-17 (×2): 5 mL via EPIDURAL

## 2024-07-17 MED ORDER — FUROSEMIDE 20 MG PO TABS
20.0000 mg | ORAL_TABLET | Freq: Every day | ORAL | Status: DC
  Administered 2024-07-17 – 2024-07-19 (×3): 20 mg via ORAL
  Filled 2024-07-17 (×3): qty 1

## 2024-07-17 MED ORDER — LACTATED RINGERS IV SOLN: 500.0000 mL | INTRAVENOUS | Status: AC | PRN

## 2024-07-17 MED ORDER — ACETAMINOPHEN 325 MG PO TABS: 650.0000 mg | ORAL_TABLET | ORAL | Status: DC | PRN

## 2024-07-17 MED ORDER — MAGNESIUM HYDROXIDE 400 MG/5ML PO SUSP: 30.0000 mL | Freq: Every day | ORAL | Status: DC | PRN

## 2024-07-17 MED ORDER — HYDROXYZINE HCL 25 MG PO TABS: 50.0000 mg | ORAL_TABLET | Freq: Four times a day (QID) | ORAL | Status: DC | PRN

## 2024-07-17 MED ORDER — ONDANSETRON HCL 4 MG/2ML IJ SOLN: 4.0000 mg | INTRAMUSCULAR | Status: DC | PRN

## 2024-07-17 MED ORDER — SOD CITRATE-CITRIC ACID 500-334 MG/5ML PO SOLN
30.0000 mL | ORAL | Status: DC | PRN
Start: 2024-07-17 — End: 2024-07-19

## 2024-07-17 MED ORDER — BENZOCAINE-MENTHOL 20-0.5 % EX AERO
1.0000 | INHALATION_SPRAY | CUTANEOUS | Status: DC | PRN
Start: 2024-07-17 — End: 2024-07-19

## 2024-07-17 MED ORDER — MEASLES, MUMPS & RUBELLA VAC IJ SOLR: 0.5000 mL | Freq: Once | INTRAMUSCULAR | Status: DC

## 2024-07-17 MED ORDER — LACTATED RINGERS IV SOLN: INTRAVENOUS | Status: DC

## 2024-07-17 MED ORDER — POTASSIUM CHLORIDE CRYS ER 20 MEQ PO TBCR
20.0000 meq | EXTENDED_RELEASE_TABLET | Freq: Every day | ORAL | Status: DC
  Administered 2024-07-17 – 2024-07-19 (×3): 20 meq via ORAL
  Filled 2024-07-17 (×3): qty 1

## 2024-07-17 MED ORDER — LACTATED RINGERS IV SOLN
500.0000 mL | Freq: Once | INTRAVENOUS | Status: AC
  Administered 2024-07-17: 500 mL via INTRAVENOUS

## 2024-07-17 MED ORDER — FENTANYL-BUPIVACAINE-NACL 0.5-0.125-0.9 MG/250ML-% EP SOLN
EPIDURAL | Status: AC
Start: 2024-07-17 — End: 2024-07-17
  Filled 2024-07-17: qty 250

## 2024-07-17 MED ORDER — HYDRALAZINE HCL 20 MG/ML IJ SOLN
10.0000 mg | INTRAMUSCULAR | Status: DC | PRN
Start: 2024-07-17 — End: 2024-07-19

## 2024-07-17 MED ORDER — TETANUS-DIPHTH-ACELL PERTUSSIS 5-2.5-18.5 LF-MCG/0.5 IM SUSY: 0.5000 mL | PREFILLED_SYRINGE | Freq: Once | INTRAMUSCULAR | Status: DC

## 2024-07-17 MED ORDER — FAMOTIDINE 20 MG PO TABS
20.0000 mg | ORAL_TABLET | Freq: Two times a day (BID) | ORAL | Status: DC
  Administered 2024-07-17: 20 mg via ORAL
  Filled 2024-07-17: qty 1

## 2024-07-17 MED ORDER — OXYCODONE-ACETAMINOPHEN 5-325 MG PO TABS
1.0000 | ORAL_TABLET | ORAL | Status: DC | PRN
  Administered 2024-07-18: 1 via ORAL
  Filled 2024-07-17: qty 1

## 2024-07-17 MED ORDER — SIMETHICONE 80 MG PO CHEW: 80.0000 mg | CHEWABLE_TABLET | ORAL | Status: DC | PRN

## 2024-07-17 MED ORDER — ONDANSETRON HCL 4 MG PO TABS: 4.0000 mg | ORAL_TABLET | ORAL | Status: DC | PRN

## 2024-07-17 MED ORDER — EPHEDRINE 5 MG/ML INJ: 10.0000 mg | INTRAVENOUS | Status: DC | PRN

## 2024-07-17 MED ORDER — LABETALOL HCL 5 MG/ML IV SOLN: 20.0000 mg | INTRAVENOUS | Status: DC | PRN

## 2024-07-17 MED ORDER — PRENATAL MULTIVITAMIN CH
1.0000 | ORAL_TABLET | Freq: Every day | ORAL | Status: DC
  Administered 2024-07-19: 1 via ORAL
  Filled 2024-07-17: qty 1

## 2024-07-17 MED ORDER — FENTANYL-BUPIVACAINE-NACL 0.5-0.125-0.9 MG/250ML-% EP SOLN
12.0000 mL/h | EPIDURAL | Status: DC | PRN
  Administered 2024-07-17: 12 mL/h via EPIDURAL

## 2024-07-17 MED ORDER — DIPHENHYDRAMINE HCL 25 MG PO CAPS: 25.0000 mg | ORAL_CAPSULE | Freq: Four times a day (QID) | ORAL | Status: DC | PRN

## 2024-07-17 MED ORDER — TERBUTALINE SULFATE 1 MG/ML IJ SOLN: 0.2500 mg | Freq: Once | INTRAMUSCULAR | Status: DC | PRN

## 2024-07-17 MED ORDER — COCONUT OIL OIL: 1.0000 | TOPICAL_OIL | Status: DC | PRN

## 2024-07-17 NOTE — Progress Notes (Addendum)
 The Rn called the MD ( Faculty) regarding the patient's blood pressure. The MD stated to call if BP is equal to or greater than 140/90. In addition, a tubal is schedule for 11:00 am in the morning.

## 2024-07-17 NOTE — Anesthesia Preprocedure Evaluation (Signed)
 Anesthesia Evaluation  Patient identified by MRN, date of birth, ID band Patient awake    Reviewed: Allergy & Precautions, Patient's Chart, lab work & pertinent test results  Airway Mallampati: II  TM Distance: >3 FB     Dental  (+) Teeth Intact   Pulmonary neg pulmonary ROS   Pulmonary exam normal        Cardiovascular negative cardio ROS Normal cardiovascular exam Rhythm:Regular     Neuro/Psych negative neurological ROS  negative psych ROS   GI/Hepatic Neg liver ROS,GERD  Medicated,,  Endo/Other  Obesity  Renal/GU negative Renal ROS  negative genitourinary   Musculoskeletal negative musculoskeletal ROS (+)    Abdominal  (+) + obese  Peds  Hematology  (+) Blood dyscrasia, anemia   Anesthesia Other Findings   Reproductive/Obstetrics (+) Pregnancy AMA Gestational HTN                              Anesthesia Physical Anesthesia Plan  ASA: 2  Anesthesia Plan: Epidural   Post-op Pain Management: Minimal or no pain anticipated   Induction:   PONV Risk Score and Plan:   Airway Management Planned: Natural Airway  Additional Equipment:   Intra-op Plan:   Post-operative Plan:   Informed Consent: I have reviewed the patients History and Physical, chart, labs and discussed the procedure including the risks, benefits and alternatives for the proposed anesthesia with the patient or authorized representative who has indicated his/her understanding and acceptance.       Plan Discussed with: Anesthesiologist  Anesthesia Plan Comments:          Anesthesia Quick Evaluation

## 2024-07-17 NOTE — Anesthesia Procedure Notes (Signed)
 Epidural Patient location during procedure: OB Start time: 07/17/2024 5:22 AM End time: 07/17/2024 5:31 AM  Staffing Anesthesiologist: Jerrye Sharper, MD Performed: anesthesiologist   Preanesthetic Checklist Completed: patient identified, IV checked, site marked, risks and benefits discussed, surgical consent, monitors and equipment checked, pre-op evaluation and timeout performed  Epidural Patient position: sitting Prep: DuraPrep and site prepped and draped Patient monitoring: continuous pulse ox and blood pressure Approach: midline Location: L3-L4 Injection technique: LOR air  Needle:  Needle type: Tuohy  Needle gauge: 17 G Needle length: 9 cm and 9 Needle insertion depth: 5 cm Catheter type: closed end flexible Catheter size: 19 Gauge Catheter at skin depth: 10 cm Test dose: negative and Other  Assessment Events: blood not aspirated, no cerebrospinal fluid, injection not painful, no injection resistance, no paresthesia and negative IV test  Additional Notes Patient identified. Risks and benefits discussed including failed block, incomplete  Pain control, post dural puncture headache, nerve damage, paralysis, blood pressure Changes, nausea, vomiting, reactions to medications-both toxic and allergic and post Partum back pain. All questions were answered. Patient expressed understanding and wished to proceed. Sterile technique was used throughout procedure. Epidural site was Dressed with sterile barrier dressing. No paresthesias, signs of intravascular injection Or signs of intrathecal spread were encountered.  Patient was more comfortable after the epidural was dosed. Please see RN's note for documentation of vital signs and FHR which are stable. Reason for block:procedure for pain

## 2024-07-17 NOTE — Lactation Note (Signed)
 This note was copied from a baby's chart. Lactation Consultation Note  Patient Name: Brenda Richards Unijb'd Date: 07/17/2024 Age:42 hours Reason for consult: Initial assessment;Early term 37-38.6wks  Per MOB, her feeding choice is breast and bottle feeding. Per MOB, infant has breastfeed twice without difficulty, in L&D infant breastfeed for 30 minutes and on MBU at 1700 pm infant breastfeed for 20 minutes and afterwards MOB feeding choice infant was given formula. MOB knows to latch infant first every feeding before offering formula to help establish her milk supply. MOB will continue to breastfeed infant by cues, on demand, 8-12 times within 24 hours, skin to skin. LC discussed importance of maternal rest, meals and hydration. MOB knows to call [facility] services if she has questions, concerns or needs latch assistance. MOB was made aware of O/P services, breastfeeding support groups, community resources, and our phone # for post-discharge questions.    Maternal Data Has patient been taught Hand Expression?: Yes Does the patient have breastfeeding experience prior to this delivery?: Yes How long did the patient breastfeed?: Per MOB, she has hx low milk supply she has supplemented with formula , First child breastfeed for 3 months, 2nd child for 2 months and did not breastfeed her 3rd child.  Feeding Mother's Current Feeding Choice: Breast Milk and Formula  LATCH Score( Done by RN) Latch: Grasps breast easily, tongue down, lips flanged, rhythmical sucking.  Audible Swallowing: Spontaneous and intermittent  Type of Nipple: Everted at rest and after stimulation  Comfort (Breast/Nipple): Soft / non-tender  Hold (Positioning): No assistance needed to correctly position infant at breast.  LATCH Score: 10   Lactation Tools Discussed/Used    Interventions    Discharge Pump: Manual (Hand pump given at [facility] request.)  Consult Status Consult Status: Follow-up Date:  07/18/24 Follow-up type: In-patient    Grayce LULLA Batter 07/17/2024, 7:21 PM

## 2024-07-17 NOTE — H&P (Cosign Needed Addendum)
 OBSTETRIC ADMISSION HISTORY AND PHYSICAL  Brenda Richards is a 43 y.o. female [phone] with IUP at [redacted]w[redacted]d by LMP presenting for contractions. She reports +FMs, No LOF, no VB, no blurry vision, headaches or peripheral edema, and RUQ pain.  She plans on breast and bottle feeding. She requests BTL for birth control, but if not possible plans to take pills. She received her prenatal care at [facility] followed by [facility]  Dating: By LMP --->  Estimated Date of Delivery: 07/27/24  Sono:    @[redacted]w[redacted]d , CWD, normal anatomy, cephalic presentation, posterior placental lie, 2321g, 23% EFW   Prenatal History/Complications: term vaginal delivery (2004) followed by a term cesarean delivery (2013).  2016 preterm PPROM/ vaginal delivery at [redacted] weeks gestation. History of another child with congenital heart defect. New gHTN upon admission  Past Medical History: Past Medical History:  Diagnosis Date   Alpha thalassemia silent carrier 02/05/2024   Anemia    Gall stones    Positive PPD, treated    Umbilical hernia     Past Surgical History: Past Surgical History:  Procedure Laterality Date   CESAREAN SECTION  07/17/2012   Procedure: CESAREAN SECTION;  Surgeon: Winton Felt, MD;  Location: [facility];  Service: Gynecology;  Laterality: N/A;    Obstetrical History: OB History     Gravida  4   Para  3   Term  2   Preterm  1   AB  0   Living  3      SAB  0   IAB  0   Ectopic  0   Multiple  0   Live Births  3           Social History Social History   Socioeconomic History   Marital status: Single    Spouse name: Not on file   Number of children: Not on file   Years of education: Not on file   Highest education level: Not on file  Occupational History   Not on file  Tobacco Use   Smoking status: Never   Smokeless tobacco: Never  Vaping Use   Vaping status: Never Used  Substance and Sexual Activity   Alcohol use: Not Currently    Comment: in early pregnancy   Drug use: No    Sexual activity: Yes    Birth control/protection: None  Other Topics Concern   Not on file  Social History Narrative   Not on file   Social Drivers of Health   Financial Resource Strain: Not on file  Food Insecurity: No Food Insecurity (07/15/2024)   Hunger Vital Sign    Worried About Running Out of Food in the Last Year: Never true    Ran Out of Food in the Last Year: Never true  Transportation Needs: No Transportation Needs (07/15/2024)   PRAPARE - Administrator, Civil Service (Medical): No    Lack of Transportation (Non-Medical): No  Physical Activity: Not on file  Stress: Not on file  Social Connections: Not on file    Family History: Family History  Problem Relation Age of Onset   Diabetes Mother    Anesthesia problems Neg Hx     Allergies: No Known Allergies  Medications Prior to Admission  Medication Sig Dispense Refill Last Dose/Taking   cefadroxil  (DURICEF) 500 MG capsule Take 1 capsule (500 mg total) by mouth 2 (two) times daily. 14 capsule 0 Past Week   EQ ASPIRIN ADULT LOW DOSE 81 MG tablet Take 81 mg  by mouth daily.   07/16/2024   famotidine  (PEPCID ) 20 MG tablet Take 1 tablet (20 mg total) by mouth 2 (two) times daily. 30 tablet 2 07/16/2024   ondansetron  (ZOFRAN ) 4 MG tablet Take 1 tablet 3 times a day by oral route as needed, for pregnancy related nausea.   Past Week   Prenatal Vit-Fe Fumarate-FA (PRENATAL MULTIVITAMIN) TABS Take 1 tablet by mouth every morning.   07/16/2024   acetaminophen  (TYLENOL ) 500 MG tablet Take 2 tablets (1,000 mg total) by mouth every 6 (six) hours as needed for moderate pain (pain score 4-6), mild pain (pain score 1-3) or headache. 100 tablet 2 More than a month     Review of Systems   All systems reviewed and negative except as stated in HPI  Blood pressure (!) 140/82, pulse 97, temperature 97.7 F (36.5 C), temperature source Oral, resp. rate 14, last menstrual period 10/21/2023. General appearance: alert,  cooperative, and no distress Lungs: clear to auscultation bilaterally Heart: regular rate and rhythm Abdomen: soft, non-tender; bowel sounds normal Extremities: no lower extremity edema Presentation: cephalic Fetal monitoringBaseline: 150 bpm, Variability: Good {> 6 bpm), Accelerations: Reactive, and Decelerations: Absent Uterine activity Frequency: Every 5 minutes Dilation: 4 Effacement (%): 70 Station: -2 Exam by:: DCallaway, RN   Prenatal labs: ABO, Rh: --/--/PENDING (08/30 0410) Antibody: PENDING (08/30 0410) Rubella: Immune (02/20 0000) RPR: Non Reactive (05/30 0936)  HBsAg: Negative (02/20 0000)  HIV: Non Reactive (05/30 0936)  GBS: Negative/-- (08/13 1527)    Lab Results  Component Value Date   GBS Negative 06/30/2024   GTT WNL Genetic screening  NIPS LR F; Horizon silent carrier alpha thal Anatomy US  WNL  Immunization History  Administered Date(s) Administered   Fluzone Influenza virus vaccine,trivalent (IIV3), split virus 03/04/2012   Hepatitis B, ADULT 01/21/2003   PFIZER(Purple Top)SARS-COV-2 Vaccination 02/17/2020, 03/14/2020   Tdap 04/14/2012, 08/30/2015    Prenatal Transfer Tool  Maternal Diabetes: No Genetic Screening: Abnormal:  Results: Other: alpha thal silent carrier Maternal Ultrasounds/Referrals: Normal Fetal Ultrasounds or other Referrals:  Referred to Materal Fetal Medicine  Maternal Substance Abuse:  No Significant Maternal Medications:  None Significant Maternal Lab Results: Group B Strep negative Number of Prenatal Visits:greater than 3 verified prenatal visits Maternal Vaccinations: none Other Comments:  new gHTN upon admission   Results for orders placed or performed during the hospital encounter of 07/17/24 (from the past 24 hours)  Protein / creatinine ratio, urine   Collection Time: 07/17/24  3:31 AM  Result Value Ref Range   Creatinine, Urine 101 mg/dL   Total Protein, Urine 26 mg/dL   Protein Creatinine Ratio 0.26 (H) 0.00 -  0.15 mg/mg[Cre]  CBC   Collection Time: 07/17/24  4:10 AM  Result Value Ref Range   WBC 10.6 (H) 4.0 - 10.5 K/uL   RBC 4.78 3.87 - 5.11 MIL/uL   Hemoglobin 13.1 12.0 - 15.0 g/dL   HCT 60.3 63.9 - 53.9 %   MCV 82.8 80.0 - 100.0 fL   MCH 27.4 26.0 - 34.0 pg   MCHC 33.1 30.0 - 36.0 g/dL   RDW 85.3 88.4 - 84.4 %   Platelets 227 150 - 400 K/uL   nRBC 0.0 0.0 - 0.2 %  Comprehensive metabolic panel with GFR   Collection Time: 07/17/24  4:10 AM  Result Value Ref Range   Sodium 137 135 - 145 mmol/L   Potassium 3.4 (L) 3.5 - 5.1 mmol/L   Chloride 105 98 - 111 mmol/L  CO2 20 (L) 22 - 32 mmol/L   Glucose, Bld 125 (H) 70 - 99 mg/dL   BUN 10 6 - 20 mg/dL   Creatinine, Ser 9.33 0.44 - 1.00 mg/dL   Calcium 9.5 8.9 - 89.6 mg/dL   Total Protein 6.9 6.5 - 8.1 g/dL   Albumin 2.9 (L) 3.5 - 5.0 g/dL   AST 28 15 - 41 U/L   ALT 22 0 - 44 U/L   Alkaline Phosphatase 204 (H) 38 - 126 U/L   Total Bilirubin 0.5 0.0 - 1.2 mg/dL   GFR, Estimated >39 >39 mL/min   Anion gap 12 5 - 15  Type and screen [facility]   Collection Time: 07/17/24  4:10 AM  Result Value Ref Range   ABO/RH(D) PENDING    Antibody Screen PENDING    Sample Expiration      07/20/2024,2359 Performed at [facility], [address] [place], [place] [address]     Patient Active Problem List   Diagnosis Date Noted   Normal labor 07/17/2024   History of other child with congenital heart defect 03/17/2024   Obesity affecting pregnancy, antepartum 02/27/2024   Alpha thalassemia silent carrier 02/05/2024   Supervision of high risk pregnancy, antepartum 01/20/2024   AMA (advanced maternal age) multigravida 35+ 01/20/2024   History of cesarean section followed by successful vaginal birth (VBAC) 01/20/2024   Unwanted fertility 01/20/2024   Cholelithiasis without obstruction 06/20/2021   Fatty (change of) liver, not elsewhere classified 06/20/2021   History of preterm delivery/PPROM at 23 weeks 08/31/2015    History of cervical incompetence 08/28/2015   GERD (gastroesophageal reflux disease) 07/06/2015    Assessment/Plan:  Brenda Richards is a 43 y.o. G4P2103 at [redacted]w[redacted]d here for SOL  #Labor: SOL #Pain: epidural #FWB: Category 1 #GBS status:  negative #Feeding: Breastmilk  and Formula #Reproductive Life planning: Tubal Ligation #Circ:  not applicable  Coolidge Moros, MD  07/17/2024, 4:56 AM   GME ATTESTATION:  Evaluation and management procedures were performed by the [facility] Medicine Resident under my supervision. I was immediately available for direct supervision, assistance and direction throughout this encounter.  I also confirm that I have verified the information documented in the resident's note, and that I have also personally reperformed the pertinent components of the physical exam and all of the medical decision making activities.  I have also made any necessary editorial changes.  Leeroy KATHEE Pouch, MD OB Fellow, Faculty Practice [facility], Center for [facility] 07/19/2024 9:10 PM

## 2024-07-17 NOTE — Progress Notes (Signed)
 LABOR PROGRESS NOTE Pt rechecked at 1130 Patient comfortable with epidural. Pit at none. SCE: 6/asym effacement 70% anteriorly, thinner posteriorly/-1 AROM minimal fluid output IUPC placed FHT: baseline 150, mod variability, +accels, -decels; overall category I. Toco: poor capture  A/P:  Monitor for , augment with pit PRN  Barabara Maier, DO 11:49 AM

## 2024-07-17 NOTE — Discharge Summary (Signed)
 Postpartum Discharge Summary  Date of Service updated***     Patient Name: Brenda Richards DOB: Apr 26, 1981 MRN: [id number]  Date of admission: 07/17/2024 Delivery date:07/17/2024 Delivering provider: LORELI IHA D Date of discharge: 07/17/2024  Admitting diagnosis: Normal labor [O80, Z37.9] Intrauterine pregnancy: [redacted]w[redacted]d     Secondary diagnosis:  Principal Problem:   Normal labor Active Problems:   AMA (advanced maternal age) multigravida 35+   History of cesarean section followed by successful vaginal birth (VBAC)   Unwanted fertility   Gestational hypertension  Additional problems: ***    Discharge diagnosis: {DX.:23714}                                              Post partum procedures:{Postpartum procedures:23558} Augmentation: AROM and Pitocin  Complications: None  Hospital course: Onset of Labor With Vaginal Delivery      43 y.o. yo H5E7896 at [redacted]w[redacted]d was admitted in Latent Labor on 07/17/2024. Labor course was complicated bynone   Membrane Rupture Time/Date: 11:33 AM,07/17/2024  Delivery Method:Vaginal, Spontaneous Operative Delivery:N/A Episiotomy: None Lacerations:  None  Patient had a postpartum course complicated by ***.  She is ambulating, tolerating a regular diet, passing flatus, and urinating well. Patient is discharged home in stable condition on 07/17/24.  Newborn Data: Birth date:07/17/2024 Birth time:3:47 PM Gender:Female Living status:Living Apgars:8 ,9  Weight:   Magnesium  Sulfate received: {Mag received:30440022} BMZ received: No Rhophylac:N/A MMR:{MMR:30440033} T-DaP:{Tdap:23962} Flu: N/A RSV Vaccine received: No Transfusion:{Transfusion received:30440034}  Immunizations received: Immunization History  Administered Date(s) Administered   Fluzone Influenza virus vaccine,trivalent (IIV3), split virus 03/04/2012   Hepatitis B, ADULT 01/21/2003   PFIZER(Purple Top)SARS-COV-2 Vaccination 02/17/2020, 03/14/2020   Tdap 04/14/2012,  08/30/2015    Physical exam  Vitals:   07/17/24 1502 07/17/24 1532 07/17/24 1601 07/17/24 1622  BP: 115/71 109/73 129/83 (!) 164/94  Pulse: 92 (!) 106 (!) 103 (!) 122  Resp: 16     Temp:      TempSrc:      SpO2:      Weight:      Height:       General: {Exam; general:21111117} Lochia: {Desc; appropriate/inappropriate:30686::appropriate} Uterine Fundus: {Desc; firm/soft:30687} Incision: {Exam; incision:21111123} DVT Evaluation: {Exam; dvt:2111122} Labs: Lab Results  Component Value Date   WBC 10.6 (H) 07/17/2024   HGB 13.1 07/17/2024   HCT 39.6 07/17/2024   MCV 82.8 07/17/2024   PLT 227 07/17/2024      Latest Ref Rng & Units 07/17/2024    4:10 AM  CMP  Glucose 70 - 99 mg/dL 874   BUN 6 - 20 mg/dL 10   Creatinine 9.55 - 1.00 mg/dL 9.33   Sodium 864 - 854 mmol/L 137   Potassium 3.5 - 5.1 mmol/L 3.4   Chloride 98 - 111 mmol/L 105   CO2 22 - 32 mmol/L 20   Calcium 8.9 - 10.3 mg/dL 9.5   Total Protein 6.5 - 8.1 g/dL 6.9   Total Bilirubin 0.0 - 1.2 mg/dL 0.5   Alkaline Phos 38 - 126 U/L 204   AST 15 - 41 U/L 28   ALT 0 - 44 U/L 22    Edinburgh Score:     No data to display         No data recorded  After visit meds:  Allergies as of 07/17/2024   No Known Allergies   Med Rec  must be completed prior to using this [facility]***        Discharge home in stable condition Infant Feeding: {Baby feeding:23562} Infant Disposition:{CHL IP OB HOME WITH FNUYZM:76418} Discharge instruction: per After Visit Summary and Postpartum booklet. Activity: Advance as tolerated. Pelvic rest for 6 weeks.  Diet: {OB ipzu:78888878} Future Appointments: Future Appointments  Date Time Provider Department Center  08/31/2024 11:15 AM Izell Harari, MD [facility]   Follow up Visit:  Message sent 07/17/2024  Please schedule this patient for a In person postpartum visit in 4 weeks with the following provider: Any provider. Additional Postpartum F/U:BP check 1 week  High  risk pregnancy complicated by: HTN and AMA, h/o PTL Delivery mode:  Vaginal, Spontaneous Anticipated Birth Control:  BTL done PP ***planned   07/17/2024 Barabara Maier, DO

## 2024-07-17 NOTE — MAU Note (Addendum)
 Pt says [facility]- clinic. VE - Thurs 3-4 cm  in MAU Had appointment on Friday- no VE - all ok Denies HSV GBS- neg

## 2024-07-18 ENCOUNTER — Inpatient Hospital Stay (HOSPITAL_COMMUNITY): Admitting: Anesthesiology

## 2024-07-18 ENCOUNTER — Encounter (HOSPITAL_COMMUNITY): Payer: Self-pay | Admitting: Obstetrics & Gynecology

## 2024-07-18 ENCOUNTER — Other Ambulatory Visit: Payer: Self-pay

## 2024-07-18 ENCOUNTER — Encounter (HOSPITAL_COMMUNITY)
Admission: AD | Disposition: A | Discharge status: Home / Self Care | Payer: Self-pay | Source: Home / Self Care | Attending: Obstetrics & Gynecology

## 2024-07-18 DIAGNOSIS — Z302 Encounter for sterilization: Secondary | ICD-10-CM | POA: Diagnosis not present

## 2024-07-18 HISTORY — PX: TUBAL LIGATION: SHX77

## 2024-07-18 LAB — CBC
HCT: 30.9 % — ABNORMAL LOW (ref 36.0–46.0)
HCT: 30.4 % — ABNORMAL LOW (ref 36.0–46.0)
Hemoglobin: 10 g/dL — ABNORMAL LOW (ref 12.0–15.0)
Hemoglobin: 9.8 g/dL — ABNORMAL LOW (ref 12.0–15.0)
MCH: 27.1 pg (ref 26.0–34.0)
MCH: 27.1 pg (ref 26.0–34.0)
MCHC: 32.4 g/dL (ref 30.0–36.0)
MCHC: 32.2 g/dL (ref 30.0–36.0)
MCV: 83.7 fL (ref 80.0–100.0)
MCV: 84.2 fL (ref 80.0–100.0)
Platelets: 201 K/uL (ref 150–400)
Platelets: 193 K/uL (ref 150–400)
RBC: 3.69 MIL/uL — ABNORMAL LOW (ref 3.87–5.11)
RBC: 3.61 MIL/uL — ABNORMAL LOW (ref 3.87–5.11)
RDW: 14.7 % (ref 11.5–15.5)
RDW: 14.9 % (ref 11.5–15.5)
WBC: 11.3 K/uL — ABNORMAL HIGH (ref 4.0–10.5)
WBC: 9.5 K/uL (ref 4.0–10.5)
nRBC: 0 % (ref 0.0–0.2)
nRBC: 0 % (ref 0.0–0.2)

## 2024-07-18 SURGERY — LIGATION, FALLOPIAN TUBE, POSTPARTUM
Anesthesia: Epidural

## 2024-07-18 MED ORDER — FERROUS SULFATE 325 (65 FE) MG PO TBEC
325.0000 mg | DELAYED_RELEASE_TABLET | Freq: Every day | ORAL | Status: DC
  Filled 2024-07-18: qty 1

## 2024-07-18 MED ORDER — ONDANSETRON HCL 4 MG/2ML IJ SOLN
INTRAMUSCULAR | Status: DC | PRN
  Administered 2024-07-18: 4 mg via INTRAVENOUS

## 2024-07-18 MED ORDER — ONDANSETRON HCL 4 MG/2ML IJ SOLN: 4.0000 mg | Freq: Once | INTRAMUSCULAR | Status: DC | PRN

## 2024-07-18 MED ORDER — OXYCODONE HCL 5 MG/5ML PO SOLN: 5.0000 mg | Freq: Once | ORAL | Status: DC | PRN

## 2024-07-18 MED ORDER — KETOROLAC TROMETHAMINE 30 MG/ML IJ SOLN
INTRAMUSCULAR | Status: DC | PRN
  Administered 2024-07-18: 30 mg via INTRAVENOUS

## 2024-07-18 MED ORDER — LIDOCAINE-EPINEPHRINE (PF) 2 %-1:200000 IJ SOLN
INTRAMUSCULAR | Status: DC | PRN
  Administered 2024-07-18 (×5): 5 mL via EPIDURAL
  Administered 2024-07-18: 3 mL via EPIDURAL
  Administered 2024-07-18: 2 mL via EPIDURAL
  Administered 2024-07-18: 4 mL via EPIDURAL

## 2024-07-18 MED ORDER — FAMOTIDINE 20 MG PO TABS: 40.0000 mg | ORAL_TABLET | Freq: Once | ORAL | Status: DC

## 2024-07-18 MED ORDER — FENTANYL CITRATE (PF) 100 MCG/2ML IJ SOLN
INTRAMUSCULAR | Status: AC
  Filled 2024-07-18: qty 2

## 2024-07-18 MED ORDER — FENTANYL CITRATE (PF) 100 MCG/2ML IJ SOLN
INTRAMUSCULAR | Status: DC | PRN
  Administered 2024-07-18 (×2): 50 ug via INTRAVENOUS

## 2024-07-18 MED ORDER — METOCLOPRAMIDE HCL 10 MG PO TABS
10.0000 mg | ORAL_TABLET | Freq: Once | ORAL | Status: AC
  Administered 2024-07-18: 10 mg via ORAL
  Filled 2024-07-18 (×2): qty 1

## 2024-07-18 MED ORDER — STERILE WATER FOR IRRIGATION IR SOLN
Status: DC | PRN
  Administered 2024-07-18: 1000 mL

## 2024-07-18 MED ORDER — LIDOCAINE-EPINEPHRINE (PF) 2 %-1:200000 IJ SOLN
INTRAMUSCULAR | Status: AC
  Filled 2024-07-18: qty 40

## 2024-07-18 MED ORDER — PROPOFOL 10 MG/ML IV BOLUS
INTRAVENOUS | Status: AC
  Filled 2024-07-18: qty 20

## 2024-07-18 MED ORDER — LACTATED RINGERS IV SOLN: INTRAVENOUS | Status: AC

## 2024-07-18 MED ORDER — PROPOFOL 500 MG/50ML IV EMUL
INTRAVENOUS | Status: DC | PRN
  Administered 2024-07-18: 40 ug/kg/min via INTRAVENOUS

## 2024-07-18 MED ORDER — PHENYLEPHRINE 80 MCG/ML (10ML) SYRINGE FOR IV PUSH (FOR BLOOD PRESSURE SUPPORT)
PREFILLED_SYRINGE | INTRAVENOUS | Status: AC
Start: 2024-07-18 — End: 2024-07-18
  Filled 2024-07-18: qty 10

## 2024-07-18 MED ORDER — MIDAZOLAM HCL 2 MG/2ML IJ SOLN
INTRAMUSCULAR | Status: AC
  Filled 2024-07-18: qty 2

## 2024-07-18 MED ORDER — FERROUS SULFATE 325 (65 FE) MG PO TABS
325.0000 mg | ORAL_TABLET | Freq: Every day | ORAL | Status: DC
  Administered 2024-07-19: 325 mg via ORAL
  Filled 2024-07-18: qty 1

## 2024-07-18 MED ORDER — EPHEDRINE 5 MG/ML INJ
INTRAVENOUS | Status: AC
Start: 2024-07-18 — End: 2024-07-18
  Filled 2024-07-18: qty 5

## 2024-07-18 MED ORDER — SODIUM CHLORIDE 0.9 % IR SOLN
Status: DC | PRN
  Administered 2024-07-18: 50 mL

## 2024-07-18 MED ORDER — DROPERIDOL 2.5 MG/ML IJ SOLN: 0.6250 mg | Freq: Once | INTRAMUSCULAR | Status: DC | PRN

## 2024-07-18 MED ORDER — PROPOFOL 500 MG/50ML IV EMUL
INTRAVENOUS | Status: AC
  Filled 2024-07-18: qty 50

## 2024-07-18 MED ORDER — BUPIVACAINE HCL (PF) 0.5 % IJ SOLN
INTRAMUSCULAR | Status: AC
  Filled 2024-07-18: qty 30

## 2024-07-18 MED ORDER — LACTATED RINGERS IV SOLN: INTRAVENOUS | Status: DC

## 2024-07-18 MED ORDER — FENTANYL CITRATE (PF) 100 MCG/2ML IJ SOLN
INTRAMUSCULAR | Status: DC | PRN
  Administered 2024-07-18: 100 ug via EPIDURAL

## 2024-07-18 MED ORDER — METOCLOPRAMIDE HCL 10 MG PO TABS: 10.0000 mg | ORAL_TABLET | Freq: Once | ORAL | Status: DC

## 2024-07-18 MED ORDER — LACTATED RINGERS IV SOLN: INTRAVENOUS | Status: DC | PRN

## 2024-07-18 MED ORDER — FAMOTIDINE 20 MG PO TABS
40.0000 mg | ORAL_TABLET | Freq: Once | ORAL | Status: AC
  Administered 2024-07-18: 40 mg via ORAL
  Filled 2024-07-18 (×2): qty 2

## 2024-07-18 MED ORDER — LIDOCAINE 2% (20 MG/ML) 5 ML SYRINGE
INTRAMUSCULAR | Status: AC
  Filled 2024-07-18: qty 5

## 2024-07-18 MED ORDER — OXYCODONE HCL 5 MG PO TABS: 5.0000 mg | ORAL_TABLET | Freq: Once | ORAL | Status: DC | PRN

## 2024-07-18 MED ORDER — HYDROMORPHONE HCL 1 MG/ML IJ SOLN: 0.2500 mg | INTRAMUSCULAR | Status: DC | PRN

## 2024-07-18 SURGICAL SUPPLY — 25 items
BENZOIN TINCTURE PRP APPL 2/3 (GAUZE/BANDAGES/DRESSINGS) IMPLANT
BLADE SURG 11 STRL SS (BLADE) ×1 IMPLANT
CLOTH BEACON ORANGE TIMEOUT ST (SAFETY) ×1 IMPLANT
DISSECTOR SURG LIGASURE 21 (MISCELLANEOUS) IMPLANT
DRSG OPSITE POSTOP 3X4 (GAUZE/BANDAGES/DRESSINGS) ×1 IMPLANT
DURAPREP 26ML APPLICATOR (WOUND CARE) ×1 IMPLANT
ELECTRODE REM PT RTRN 9FT ADLT (ELECTROSURGICAL) ×1 IMPLANT
GLOVE BIOGEL PI IND STRL 7.0 (GLOVE) ×3 IMPLANT
GLOVE ECLIPSE 7.0 STRL STRAW (GLOVE) ×1 IMPLANT
GOWN STRL REUS W/TWL LRG LVL3 (GOWN DISPOSABLE) ×2 IMPLANT
MAT PREVALON FULL STRYKER (MISCELLANEOUS) IMPLANT
NEEDLE HYPO 22GX1.5 SAFETY (NEEDLE) ×1 IMPLANT
NS IRRIG 1000ML POUR BTL (IV SOLUTION) ×1 IMPLANT
PACK ABDOMINAL MINOR (CUSTOM PROCEDURE TRAY) ×1 IMPLANT
PENCIL BUTTON HOLSTER BLD 10FT (ELECTRODE) ×1 IMPLANT
PROTECTOR NERVE ULNAR (MISCELLANEOUS) ×1 IMPLANT
SPONGE LAP 4X18 RFD (DISPOSABLE) IMPLANT
SUT MON AB 3-0 SH27 (SUTURE) IMPLANT
SUT VIC AB 0 CT1 27XBRD ANBCTR (SUTURE) ×1 IMPLANT
SUT VIC AB 4-0 KS 27 (SUTURE) IMPLANT
SUT VICRYL 4-0 PS2 18IN ABS (SUTURE) ×1 IMPLANT
SYR CONTROL 10ML LL (SYRINGE) ×1 IMPLANT
TOWEL OR 17X24 6PK STRL BLUE (TOWEL DISPOSABLE) ×2 IMPLANT
TRAY FOLEY CATH SILVER 14FR (SET/KITS/TRAYS/PACK) ×1 IMPLANT
WATER STERILE IRR 1000ML POUR (IV SOLUTION) ×1 IMPLANT

## 2024-07-18 NOTE — Addendum Note (Signed)
 Addendum  created 07/18/24 1604 by Jerrye Sharper, MD   Clinical Note Signed

## 2024-07-18 NOTE — Op Note (Signed)
 Brenda Richards  07/18/2024  PREOPERATIVE DIAGNOSIS:  Multiparity, undesired fertility  POSTOPERATIVE DIAGNOSIS:  Multiparity, undesired fertility  PROCEDURE:  Postpartum Bilateral Tubal Salpingectomy   ANESTHESIA:  Epidural  COMPLICATIONS:  None immediate.  ESTIMATED BLOOD LOSS: 59 ml.  INDICATIONS: 43 y.o. H5E6895  with undesired fertility,status post vaginal delivery, desires permanent sterilization.  Other reversible forms of contraception were discussed with patient; she declines all other modalities. Risks of procedure discussed with patient including but not limited to: risk of regret, permanence of method, bleeding, infection, injury to surrounding organs and need for additional procedures.  Failure risk of 0.5-1% with increased risk of ectopic gestation if pregnancy occurs was also discussed with patient.     FINDINGS:  Normal uterus, tubes, and ovaries.  PROCEDURE DETAILS: The patient was taken to the operating room where her spinal anesthesia was dosed up to surgical level and found to be adequate.  She was then placed in a supine position and prepped and draped in the usual sterile fashion.  After an adequate timeout was performed, attention was turned to the patient's abdomen where a small transverse skin incision was made under the umbilical fold. The incision was taken down to the layer of fascia using the scalpel, and fascia was incised, and extended bilaterally. The peritoneum was entered in a sharp fashion. The patient was placed in Trendelenburg.  A moist lap pad was used to move omentum and bowel away until the right fallopian tube was identified and grasped with a Babcock clamp, and followed out to the fimbriated end.  Ligasure device was used to cauterize and cut the mesosalpinx to proximal end of the fallopian tube, removing 6cm of tube. A small area of bleeding was noted of mesosalpinx, with several running locked sutures of 3-0 Monocryl placed with adequate  hemostasis subsequently. A similar process was carried out on the right side allowing for bilateral tubal sterilization. Good hemostasis was noted overall. The instruments were then removed from the patient's abdomen and the fascial incision was repaired with 0 Vicryl, and the skin was closed with a 4-0 Vicryl subcuticular stitch. The patient tolerated the procedure well.  Sponge, lap, and needle counts were correct times two.  The patient was then taken to the recovery room awake, extubated and in stable condition.  Alain Sor MD 07/18/2024 2:02 PM

## 2024-07-18 NOTE — Anesthesia Preprocedure Evaluation (Signed)
 Anesthesia Evaluation  Patient identified by MRN, date of birth, ID band Patient awake    Reviewed: Allergy & Precautions, H&P , NPO status , Patient's Chart, lab work & pertinent test results, reviewed documented beta blocker date and time   History of Anesthesia Complications Negative for: history of anesthetic complications  Airway Mallampati: II  TM Distance: >3 FB Neck ROM: full    Dental no notable dental hx. (+) Teeth Intact   Pulmonary neg pulmonary ROS   Pulmonary exam normal breath sounds clear to auscultation       Cardiovascular hypertension, Normal cardiovascular exam Rhythm:Regular Rate:Normal     Neuro/Psych negative neurological ROS  negative psych ROS   GI/Hepatic Neg liver ROS,GERD  ,,  Endo/Other  negative endocrine ROS    Renal/GU negative Renal ROS     Musculoskeletal   Abdominal  (+) + obese  Peds  Hematology  (+) Blood dyscrasia, anemia   Anesthesia Other Findings   Reproductive/Obstetrics Desires sterilization                              Anesthesia Physical Anesthesia Plan  ASA: 2  Anesthesia Plan: Epidural   Post-op Pain Management: Minimal or no pain anticipated   Induction: Intravenous  PONV Risk Score and Plan: 4 or greater and Treatment may vary due to age or medical condition and Ondansetron   Airway Management Planned: Natural Airway  Additional Equipment: None  Intra-op Plan:   Post-operative Plan:   Informed Consent: I have reviewed the patients History and Physical, chart, labs and discussed the procedure including the risks, benefits and alternatives for the proposed anesthesia with the patient or authorized representative who has indicated his/her understanding and acceptance.     Dental advisory given  Plan Discussed with: Anesthesiologist and CRNA  Anesthesia Plan Comments:          Anesthesia Quick Evaluation

## 2024-07-18 NOTE — Progress Notes (Signed)
 The Rn called Cashion MD regarding a scheduled Pepcid  BID order and a Reglan  order for 2200 07/17/24. Both Pepcid  and Reglan  were given last night.   Since, the patient is having a tubal at 1100 on 07/18/2024. Cashion MD stated to discontinue the Pepcid  order BID, so there will not be an overlap with the tubal orders for Pepcid . Also, per Cashion MD giving the Reglan  last night will not interfere with the tubal order for the Reglan  at 11:00.

## 2024-07-18 NOTE — Transfer of Care (Signed)
 Immediate Anesthesia Transfer of Care Note  Patient: Brenda Richards  Procedure(s) Performed: LIGATION, FALLOPIAN TUBE, POSTPARTUM  Patient Location: PACU  Anesthesia Type:Epidural  Level of Consciousness: awake, alert , and oriented  Airway & Oxygen Therapy: Patient Spontanous Breathing  Post-op Assessment: Report given to RN and Post -op Vital signs reviewed and stable  Post vital signs: Reviewed and stable  Last Vitals:  Vitals Value Taken Time  BP 96/57 07/18/24 14:11  Temp    Pulse 72 07/18/24 14:12  Resp 12 07/18/24 14:12  SpO2 98 % 07/18/24 14:12  Vitals shown include unfiled device data.  Last Pain:  Vitals:   07/18/24 1143  TempSrc:   PainSc: 0-No pain      Patients Stated Pain Goal: 0 (07/17/24 1852)  Complications: No notable events documented.

## 2024-07-18 NOTE — Anesthesia Postprocedure Evaluation (Addendum)
 Anesthesia Post Note  Patient: Brenda Richards  Procedure(s) Performed: LIGATION, FALLOPIAN TUBE, POSTPARTUM     Patient location during evaluation: PACU Anesthesia Type: Epidural Level of consciousness: awake and alert Pain management: pain level controlled Vital Signs Assessment: post-procedure vital signs reviewed and stable Respiratory status: spontaneous breathing, nonlabored ventilation and respiratory function stable Cardiovascular status: stable Postop Assessment: no headache, no backache, epidural receding and no apparent nausea or vomiting Anesthetic complications: no   No notable events documented.  Last Vitals:  Vitals:   07/18/24 1445 07/18/24 1500  BP: (!) 101/59 96/63  Pulse: 66 66  Resp: 13 16  Temp:  (!) 35.8 C  SpO2: 98% 98%    Last Pain:  Vitals:   07/18/24 1500  TempSrc: Axillary  PainSc: 0-No pain   Pain Goal: Patients Stated Pain Goal: 0 (07/17/24 1852)  LLE Motor Response: No movement due to regional block (07/18/24 1500) LLE Sensation: Decreased (07/18/24 1500) RLE Motor Response: No movement due to regional block (07/18/24 1500) RLE Sensation: Decreased (07/18/24 1500)     Epidural/Spinal Function Cutaneous sensation: Able to Discern Pressure (07/18/24 1500), Patient able to flex knees: No (07/18/24 1500), Patient able to lift hips off bed: No (07/18/24 1500), Back pain beyond tenderness at insertion site: No (07/18/24 1500), Progressively worsening motor and/or sensory loss: No (07/18/24 1500), Bowel and/or bladder incontinence post epidural: No (07/18/24 1500)  Maleyah Evans A.

## 2024-07-18 NOTE — Progress Notes (Addendum)
 POSTPARTUM PROGRESS NOTE  Post Partum Day 1  Subjective:  Brenda Richards is a 43 y.o. H5E6895 s/p VBAC at [redacted]w[redacted]d.  No acute events overnight.  Pt denies problems with ambulating, voiding or po intake.  She denies nausea or vomiting.  Pain is well controlled.  She has had flatus. She has not had bowel movement.  Lochia Small.   Objective: Blood pressure (!) 93/54, pulse 65, temperature 97.9 F (36.6 C), temperature source Oral, resp. rate 18, height 5' 4 (1.626 m), weight 87.1 kg, last menstrual period 10/21/2023, SpO2 98%, unknown if currently breastfeeding.  Physical Exam:  General: alert, cooperative and no distress Chest: no respiratory distress Heart:regular rate, distal pulses intact Abdomen: soft, nontender,  Uterine Fundus: firm, appropriately tender DVT Evaluation: No calf swelling or tenderness Extremities: no edema Skin: warm, dry;   Recent Labs    07/17/24 0410 07/18/24 0442  HGB 13.1 10.0*  HCT 39.6 30.9*    Assessment/Plan: Brenda Richards is a 43 y.o. H5E6895 s/p VBAC at [redacted]w[redacted]d   PPD#1 - Doing well, meeting milestones New gHTN: started on lasix  and potassium for total of 5 days. Postpartum BP normo-hypotensive. Contraception: PP BTL to be done today  Feeding: breast and formula while waiting on milk supply Dispo: Plan for discharge 9/1-9/2.   LOS: 1 day   Shana Squires, MD 07/18/2024, 8:49 AM    Attestation of Supervision of Resident:  I confirm that I have verified the information documented in the resident's note and that I have also personally reperformed the history, physical exam and all medical decision making activities.  I have verified that all services and findings are accurately documented in this resident's note; and I agree with management and plan as outlined in the documentation. I have also made any necessary editorial changes.   Alain Sor, MD OB Fellow, Faculty Practice [facility], Center for [facility]

## 2024-07-19 MED ORDER — EASY-LAX PLUS 8.6-50 MG PO TABS
2.0000 | ORAL_TABLET | Freq: Every evening | ORAL | 0 refills | Status: AC | PRN
Start: 2024-07-19 — End: ?

## 2024-07-19 MED ORDER — FERROUS SULFATE 325 (65 FE) MG PO TABS: 325.0000 mg | ORAL_TABLET | ORAL | 0 refills | Status: AC

## 2024-07-19 MED ORDER — FUROSEMIDE 20 MG PO TABS: 20.0000 mg | ORAL_TABLET | Freq: Every day | ORAL | 0 refills | Status: AC

## 2024-07-19 MED ORDER — IBUPROFEN 600 MG PO TABS: 600.0000 mg | ORAL_TABLET | Freq: Four times a day (QID) | ORAL | 0 refills | Status: AC

## 2024-07-19 MED ORDER — ACETAMINOPHEN 500 MG PO TABS: 1000.0000 mg | ORAL_TABLET | Freq: Three times a day (TID) | ORAL | 2 refills | Status: AC | PRN

## 2024-07-19 MED ORDER — POTASSIUM CHLORIDE CRYS ER 20 MEQ PO TBCR: 20.0000 meq | EXTENDED_RELEASE_TABLET | Freq: Every day | ORAL | 0 refills | Status: AC

## 2024-07-19 NOTE — Progress Notes (Signed)
Ordered pt meals by Brenda Richards [employer]preter. 

## 2024-07-19 NOTE — Anesthesia Postprocedure Evaluation (Signed)
 Anesthesia Post Note  Patient: Brenda Richards  Procedure(s) Performed: AN AD HOC LABOR EPIDURAL     Patient location during evaluation: Mother Baby Anesthesia Type: Epidural Level of consciousness: awake and alert and oriented Pain management: satisfactory to patient Vital Signs Assessment: post-procedure vital signs reviewed and stable Respiratory status: respiratory function stable Cardiovascular status: stable Postop Assessment: no headache, no backache, epidural receding, patient able to bend at knees, no signs of nausea or vomiting, adequate PO intake and able to ambulate Anesthetic complications: no   No notable events documented.  Last Vitals:  Vitals:   07/18/24 2344 07/19/24 0445  BP: 118/61 115/61  Pulse: 66 65  Resp: 18 18  Temp: 36.7 C 36.9 C  SpO2: 100% 99%    Last Pain:  Vitals:   07/19/24 0844  TempSrc:   PainSc: 7    Pain Goal: Patients Stated Pain Goal: 0 (07/17/24 1852)              Epidural/Spinal Function Cutaneous sensation: Normal sensation (07/19/24 0832), Patient able to flex knees: Yes (07/19/24 9167), Patient able to lift hips off bed: Yes (07/19/24 0832), Back pain beyond tenderness at insertion site: No (07/19/24 0832), Progressively worsening motor and/or sensory loss: No (07/19/24 0832), Bowel and/or bladder incontinence post epidural: No (07/19/24 9167)  Tashara Suder

## 2024-07-20 ENCOUNTER — Ambulatory Visit

## 2024-07-20 ENCOUNTER — Other Ambulatory Visit

## 2024-07-20 ENCOUNTER — Inpatient Hospital Stay (HOSPITAL_COMMUNITY)

## 2024-07-20 DIAGNOSIS — O0990 Supervision of high risk pregnancy, unspecified, unspecified trimester: Secondary | ICD-10-CM

## 2024-07-20 DIAGNOSIS — D563 Thalassemia minor: Secondary | ICD-10-CM

## 2024-07-20 LAB — TYPE AND SCREEN
ABO/RH(D): O POS
Antibody Screen: POSITIVE
Donor AG Type: NEGATIVE
Unit division: 0
Unit division: 0
Unit division: 0

## 2024-07-20 LAB — BPAM RBC
Blood Product Expiration Date: 202509112359
Blood Product Expiration Date: 202509122359
Unit Type and Rh: 5100
Unit Type and Rh: 5100

## 2024-07-21 ENCOUNTER — Telehealth: Payer: Self-pay | Admitting: Family Medicine

## 2024-07-21 LAB — SURGICAL PATHOLOGY

## 2024-07-21 NOTE — Telephone Encounter (Signed)
 Patient called to get appointment information. When I gave her the appointment for her postpartum visit, she requested to know who the provider was. I told her Dr. Izell. She said she is suppose to see Dr. Cresenzo. She said he told her to over schedule her if we needed too.

## 2024-07-22 ENCOUNTER — Telehealth: Payer: Self-pay | Admitting: *Deleted

## 2024-07-22 NOTE — Telephone Encounter (Signed)
 Received message from [employer] stating that pt had BP on 9/3 of 148/74. I called her and she stated that she did not have H/A or visual disturbances yesterday. Today BP was 130/82. She will continue to check BP daily. Pt had question regarding her surgery for BTS. She wanted to know if she needs office appt to check her incision after she removes the dressing today. I advised she does not need office appt unless she observes redness, swelling or drainage from the incision. Per chart review, pt has BP check appt on 9/10. I advised the nurse can check the incision on that Brenda Richards for her assurance. She voiced understanding.

## 2024-07-28 ENCOUNTER — Ambulatory Visit (INDEPENDENT_AMBULATORY_CARE_PROVIDER_SITE_OTHER): Admitting: *Deleted

## 2024-07-28 VITALS — BP 117/74 | HR 75 | Ht 64.0 in | Wt 179.7 lb

## 2024-07-28 DIAGNOSIS — Z0289 Encounter for other administrative examinations: Secondary | ICD-10-CM

## 2024-07-28 DIAGNOSIS — Z0130 Encounter for examination of blood pressure without abnormal findings: Secondary | ICD-10-CM

## 2024-07-28 NOTE — Progress Notes (Signed)
 Here for BP check s/p vaginal delivery 07/17/24 with new onset GHTN.  No BP meds prescribed. BP wnl today at 117/74. No edema noted. No c/o headaches. Advised to schedule postpartum visit. Reviewed symptoms of preeclampsia. She voices understand.  [place], RN

## 2024-08-04 ENCOUNTER — Telehealth: Payer: Self-pay

## 2024-08-04 NOTE — Telephone Encounter (Signed)
 RN called pt to inform her that the [facility] paperwork she submitted has been completed and faxed to the number she provided [phone].  Fax confirmation was received.  PT verbalized understanding.   [place], RN

## 2024-08-27 NOTE — Progress Notes (Signed)
 Opened in error. See NST appt for documentation

## 2024-08-31 ENCOUNTER — Ambulatory Visit: Admitting: Obstetrics and Gynecology

## 2024-09-01 ENCOUNTER — Ambulatory Visit (INDEPENDENT_AMBULATORY_CARE_PROVIDER_SITE_OTHER): Admitting: Family Medicine

## 2024-09-01 NOTE — Progress Notes (Signed)
 Post Partum Visit Note  Brenda Richards is a 43 y.o. [phone] female who presents for a postpartum visit. She is 6 weeks postpartum following a normal spontaneous vaginal delivery.  I have fully reviewed the prenatal and intrapartum course. The delivery was at [redacted]w[redacted]d gestational weeks.  Anesthesia: epidural. Postpartum course has been uncomplicated. Baby is doing well. Baby is feeding by both breast and bottle - Similac total care. Bleeding no bleeding. Bowel function is normal. Bladder function is normal. Patient is not sexually active. Contraception method is tubal ligation. Postpartum depression screening: negative.   Upstream - 09/01/24 1625       Pregnancy Intention Screening   Does the patient want to become pregnant in the next year? No    Does the patient's partner want to become pregnant in the next year? No    Would the patient like to discuss contraceptive options today? No      Contraception Wrap Up   Current Method Female Sterilization    End Method Female Sterilization    Contraception Counseling Provided No    How was the end contraceptive method provided? N/A         The pregnancy intention screening data noted above was reviewed. Potential methods of contraception were discussed. The patient elected to proceed with Female Sterilization.    Health Maintenance Due  Topic Date Due   Hepatitis B Vaccines 19-59 Average Risk (2 of 3 - 19+ 3-dose series) 02/18/2003   HPV VACCINES (1 - 3-dose SCDM series) Never done   Influenza Vaccine  06/18/2024   COVID-19 Vaccine (3 - 2025-26 season) 07/19/2024    The following portions of the patient's history were reviewed and updated as appropriate: allergies, current medications, past family history, past medical history, past social history, past surgical history, and problem list.  Review of Systems Pertinent items are noted in HPI.  Objective:  BP 127/85   Pulse 70   Wt 179 lb (81.2 kg)   LMP 10/21/2023 (Exact  Date)   Breastfeeding Yes   BMI 30.73 kg/m    General:  alert, cooperative, and appears stated age   Breasts:  not indicated  Lungs: Normal work of breathing, speaking in full sentences  Heart:  Regular rate  Abdomen: Soft, nontender, surgical incision healing well   Wound Healing well  GU exam:  not indicated       Assessment:    There are no diagnoses linked to this encounter.  Normal postpartum exam.   Plan:   Essential components of care per ACOG recommendations:  1.  Mood and well being: Patient with negative depression screening today. Reviewed local resources for support.  - Patient tobacco use? No.   - hx of drug use? No.    2. Infant care and feeding:  -Patient currently breastmilk feeding? Yes. Discussed returning to work and pumping. Reviewed importance of draining breast regularly to support lactation.  -Social determinants of health (SDOH) reviewed in EPIC. No concerns  3. Sexuality, contraception and birth spacing - Patient does not want a pregnancy in the next year.  Postpartum tubal ligation performed  4. Sleep and fatigue -Encouraged family/partner/community support of 4 hrs of uninterrupted sleep to help with mood and fatigue  5. Physical Recovery  - Discussed patients delivery and complications. She describes her labor as good. - Patient had a Vaginal, no problems at delivery. Perineal healing reviewed. Patient expressed understanding - Patient has urinary incontinence? No. - Patient is safe to resume physical  and sexual activity  6.  Health Maintenance - HM due items addressed Yes - Last pap smear No results found for: DIAGPAP Pap smear not done at today's visit.  -Breast Cancer screening indicated? No.  Last performed December 2024 7. Chronic Disease/Pregnancy Condition follow up: None  - PCP follow up  Norleen LULLA Rover, MD [facility], [facility]

## 2024-09-02 ENCOUNTER — Encounter: Payer: Self-pay | Admitting: Family Medicine

## 2024-09-15 ENCOUNTER — Ambulatory Visit: Admitting: Family Medicine

## 2024-11-03 ENCOUNTER — Other Ambulatory Visit: Payer: Self-pay | Admitting: Physician Assistant

## 2024-11-03 ENCOUNTER — Encounter: Payer: Self-pay | Admitting: Family Medicine

## 2024-11-03 DIAGNOSIS — Z1231 Encounter for screening mammogram for malignant neoplasm of breast: Secondary | ICD-10-CM

## 2024-11-24 ENCOUNTER — Ambulatory Visit
Admission: RE | Admit: 2024-11-24 | Discharge: 2024-11-24 | Disposition: A | Discharge status: Home / Self Care | Source: Ambulatory Visit | Attending: Physician Assistant | Admitting: Physician Assistant

## 2024-11-24 DIAGNOSIS — Z1231 Encounter for screening mammogram for malignant neoplasm of breast: Secondary | ICD-10-CM
